# Patient Record
Sex: Male | Born: 1970 | Race: White | Hispanic: No | Marital: Married | Smoking: Former smoker
Health system: Southern US, Community
[De-identification: ages and names within clinical notes are randomized; demographics above are authoritative.]

## PROBLEM LIST (undated history)

## (undated) DIAGNOSIS — F419 Anxiety disorder, unspecified: Secondary | ICD-10-CM

## (undated) DIAGNOSIS — R110 Nausea: Secondary | ICD-10-CM

## (undated) DIAGNOSIS — K5792 Diverticulitis of intestine, part unspecified, without perforation or abscess without bleeding: Secondary | ICD-10-CM

## (undated) DIAGNOSIS — E785 Hyperlipidemia, unspecified: Secondary | ICD-10-CM

## (undated) DIAGNOSIS — N321 Vesicointestinal fistula: Secondary | ICD-10-CM

## (undated) DIAGNOSIS — I10 Essential (primary) hypertension: Secondary | ICD-10-CM

## (undated) DIAGNOSIS — K219 Gastro-esophageal reflux disease without esophagitis: Secondary | ICD-10-CM

## (undated) DIAGNOSIS — F32A Depression, unspecified: Secondary | ICD-10-CM

## (undated) DIAGNOSIS — F329 Major depressive disorder, single episode, unspecified: Secondary | ICD-10-CM

## (undated) DIAGNOSIS — R55 Syncope and collapse: Secondary | ICD-10-CM

## (undated) DIAGNOSIS — F319 Bipolar disorder, unspecified: Secondary | ICD-10-CM

## (undated) DIAGNOSIS — S46219A Strain of muscle, fascia and tendon of other parts of biceps, unspecified arm, initial encounter: Secondary | ICD-10-CM

## (undated) HISTORY — DX: Major depressive disorder, single episode, unspecified: F32.9

## (undated) HISTORY — DX: Strain of muscle, fascia and tendon of other parts of biceps, unspecified arm, initial encounter: S46.219A

## (undated) HISTORY — DX: Diverticulitis of intestine, part unspecified, without perforation or abscess without bleeding: K57.92

## (undated) HISTORY — DX: Depression, unspecified: F32.A

## (undated) HISTORY — DX: Vesicointestinal fistula: N32.1

## (undated) HISTORY — DX: Gastro-esophageal reflux disease without esophagitis: K21.9

## (undated) HISTORY — DX: Syncope and collapse: R55

## (undated) HISTORY — DX: Essential (primary) hypertension: I10

## (undated) HISTORY — DX: Bipolar disorder, unspecified: F31.9

## (undated) HISTORY — DX: Anxiety disorder, unspecified: F41.9

## (undated) HISTORY — DX: Hyperlipidemia, unspecified: E78.5

---

## 2008-03-23 HISTORY — PX: APPENDECTOMY: SHX54

## 2009-01-21 HISTORY — PX: COLECTOMY WITH COLOSTOMY CREATION/HARTMANN PROCEDURE: SHX6598

## 2009-02-02 ENCOUNTER — Inpatient Hospital Stay: Payer: Self-pay | Admitting: Surgery

## 2009-02-21 ENCOUNTER — Ambulatory Visit: Payer: Self-pay | Admitting: Surgery

## 2009-03-23 DIAGNOSIS — N321 Vesicointestinal fistula: Secondary | ICD-10-CM

## 2009-03-23 DIAGNOSIS — Z8719 Personal history of other diseases of the digestive system: Secondary | ICD-10-CM | POA: Insufficient documentation

## 2009-03-23 HISTORY — PX: ROTATOR CUFF REPAIR: SHX139

## 2009-03-23 HISTORY — DX: Vesicointestinal fistula: N32.1

## 2009-06-06 ENCOUNTER — Ambulatory Visit: Payer: Self-pay | Admitting: Surgery

## 2009-06-27 ENCOUNTER — Ambulatory Visit: Payer: Self-pay | Admitting: Gastroenterology

## 2009-07-19 ENCOUNTER — Ambulatory Visit: Payer: Self-pay | Admitting: Surgery

## 2009-07-21 HISTORY — PX: COLOSTOMY TAKEDOWN: SHX5258

## 2009-07-26 ENCOUNTER — Ambulatory Visit: Payer: Self-pay | Admitting: Orthopedic Surgery

## 2009-08-02 ENCOUNTER — Inpatient Hospital Stay: Payer: Self-pay | Admitting: Surgery

## 2009-08-13 ENCOUNTER — Ambulatory Visit: Payer: Self-pay | Admitting: Orthopedic Surgery

## 2009-09-16 ENCOUNTER — Emergency Department: Payer: Self-pay | Admitting: Unknown Physician Specialty

## 2009-12-22 ENCOUNTER — Emergency Department: Payer: Self-pay | Admitting: Emergency Medicine

## 2011-04-24 DIAGNOSIS — S46219A Strain of muscle, fascia and tendon of other parts of biceps, unspecified arm, initial encounter: Secondary | ICD-10-CM

## 2011-04-24 HISTORY — PX: BICEPS TENDON REPAIR: SHX566

## 2011-04-24 HISTORY — DX: Strain of muscle, fascia and tendon of other parts of biceps, unspecified arm, initial encounter: S46.219A

## 2011-05-06 ENCOUNTER — Emergency Department: Payer: Self-pay | Admitting: Emergency Medicine

## 2011-05-10 LAB — COMPREHENSIVE METABOLIC PANEL
Alkaline Phosphatase: 79 U/L (ref 50–136)
Anion Gap: 10 (ref 7–16)
BUN: 7 mg/dL (ref 7–18)
Bilirubin,Total: 0.3 mg/dL (ref 0.2–1.0)
Calcium, Total: 9 mg/dL (ref 8.5–10.1)
Chloride: 108 mmol/L — ABNORMAL HIGH (ref 98–107)
Co2: 23 mmol/L (ref 21–32)
Creatinine: 0.93 mg/dL (ref 0.60–1.30)
EGFR (Non-African Amer.): >60
Osmolality: 279 (ref 275–301)
Potassium: 3.6 mmol/L (ref 3.5–5.1)
Sodium: 141 mmol/L (ref 136–145)

## 2011-05-10 LAB — CBC
HCT: 46.2 % (ref 40.0–52.0)
MCV: 94 fL (ref 80–100)
Platelet: 258 10*3/uL (ref 150–440)
RBC: 4.94 10*6/uL (ref 4.40–5.90)
RDW: 13.6 % (ref 11.5–14.5)
WBC: 18.1 10*3/uL — ABNORMAL HIGH (ref 3.8–10.6)

## 2011-05-10 LAB — ACETAMINOPHEN LEVEL: Acetaminophen: 9 ug/mL — ABNORMAL LOW

## 2011-05-10 LAB — DRUG SCREEN, URINE
Barbiturates, Ur Screen: NEGATIVE (ref ?–200)
Cocaine Metabolite,Ur ~~LOC~~: NEGATIVE (ref ?–300)
MDMA (Ecstasy)Ur Screen: NEGATIVE (ref ?–500)
Opiate, Ur Screen: POSITIVE (ref ?–300)
Tricyclic, Ur Screen: NEGATIVE (ref ?–1000)

## 2011-05-10 LAB — ETHANOL
Ethanol %: <0.003 % (ref 0.000–0.080)
Ethanol: <3 mg/dL

## 2011-05-11 ENCOUNTER — Inpatient Hospital Stay: Payer: Self-pay | Admitting: Psychiatry

## 2011-05-17 ENCOUNTER — Emergency Department: Payer: Self-pay | Admitting: Internal Medicine

## 2011-05-17 LAB — COMPREHENSIVE METABOLIC PANEL
Anion Gap: 11 (ref 7–16)
BUN: 15 mg/dL (ref 7–18)
Bilirubin,Total: 0.3 mg/dL (ref 0.2–1.0)
Chloride: 100 mmol/L (ref 98–107)
Co2: 24 mmol/L (ref 21–32)
Creatinine: 0.96 mg/dL (ref 0.60–1.30)
EGFR (African American): >60
EGFR (Non-African Amer.): >60
Osmolality: 271 (ref 275–301)
Potassium: 3.3 mmol/L — ABNORMAL LOW (ref 3.5–5.1)
SGOT(AST): 18 U/L (ref 15–37)
Sodium: 135 mmol/L — ABNORMAL LOW (ref 136–145)
Total Protein: 7.8 g/dL (ref 6.4–8.2)

## 2011-05-17 LAB — ETHANOL
Ethanol %: <0.003 % (ref 0.000–0.080)
Ethanol: <3 mg/dL

## 2011-05-17 LAB — DRUG SCREEN, URINE
Amphetamines, Ur Screen: NEGATIVE (ref ?–1000)
Barbiturates, Ur Screen: NEGATIVE (ref ?–200)
Cocaine Metabolite,Ur ~~LOC~~: NEGATIVE (ref ?–300)
MDMA (Ecstasy)Ur Screen: NEGATIVE (ref ?–500)
Opiate, Ur Screen: POSITIVE (ref ?–300)
Phencyclidine (PCP) Ur S: NEGATIVE (ref ?–25)
Tricyclic, Ur Screen: NEGATIVE (ref ?–1000)

## 2011-05-17 LAB — CBC
HCT: 46.3 % (ref 40.0–52.0)
HGB: 15.9 g/dL (ref 13.0–18.0)
MCH: 32.1 pg (ref 26.0–34.0)
MCHC: 34.4 g/dL (ref 32.0–36.0)
MCV: 93 fL (ref 80–100)
RDW: 13 % (ref 11.5–14.5)
WBC: 13.8 10*3/uL — ABNORMAL HIGH (ref 3.8–10.6)

## 2011-05-17 LAB — CK TOTAL AND CKMB (NOT AT ARMC)
CK, Total: 196 U/L (ref 35–232)
CK-MB: 1.4 ng/mL (ref 0.5–3.6)

## 2011-05-17 LAB — TROPONIN I: Troponin-I: <0.02 ng/mL

## 2011-06-16 ENCOUNTER — Ambulatory Visit (INDEPENDENT_AMBULATORY_CARE_PROVIDER_SITE_OTHER): Payer: PRIVATE HEALTH INSURANCE | Admitting: Internal Medicine

## 2011-06-16 ENCOUNTER — Encounter: Payer: Self-pay | Admitting: Internal Medicine

## 2011-06-16 VITALS — BP 128/80 | HR 81 | Temp 98.0°F | Ht 64.0 in | Wt 193.0 lb

## 2011-06-16 DIAGNOSIS — I10 Essential (primary) hypertension: Secondary | ICD-10-CM

## 2011-06-16 DIAGNOSIS — Z0000 Encounter for general adult medical examination without abnormal findings: Secondary | ICD-10-CM

## 2011-06-16 DIAGNOSIS — Z8659 Personal history of other mental and behavioral disorders: Secondary | ICD-10-CM | POA: Insufficient documentation

## 2011-06-16 DIAGNOSIS — F319 Bipolar disorder, unspecified: Secondary | ICD-10-CM

## 2011-06-16 LAB — CBC WITH DIFFERENTIAL/PLATELET
Basophils Absolute: 0 10*3/uL (ref 0.0–0.1)
Eosinophils Relative: 4.5 % (ref 0.0–5.0)
HCT: 48.8 % (ref 39.0–52.0)
Hemoglobin: 16.5 g/dL (ref 13.0–17.0)
Lymphs Abs: 2.6 10*3/uL (ref 0.7–4.0)
Monocytes Relative: 10.5 % (ref 3.0–12.0)
Neutro Abs: 8.1 10*3/uL — ABNORMAL HIGH (ref 1.4–7.7)
RDW: 13.7 % (ref 11.5–14.6)

## 2011-06-16 LAB — COMPREHENSIVE METABOLIC PANEL
ALT: 22 U/L (ref 0–53)
AST: 21 U/L (ref 0–37)
BUN: 12 mg/dL (ref 6–23)
Calcium: 9.4 mg/dL (ref 8.4–10.5)
Creatinine, Ser: 0.8 mg/dL (ref 0.4–1.5)
GFR: 110.96 mL/min (ref >60.00)
Total Bilirubin: 0.3 mg/dL (ref 0.3–1.2)

## 2011-06-16 LAB — LIPID PANEL: Total CHOL/HDL Ratio: 6

## 2011-06-16 MED ORDER — AMLODIPINE BESYLATE 5 MG PO TABS: 5.0000 mg | ORAL_TABLET | Freq: Every day | ORAL | Status: DC

## 2011-06-16 NOTE — Assessment & Plan Note (Signed)
Pt reports he is doing well on Depakote. Will get records on previous evaluation and management. Follow up 1 month.

## 2011-06-16 NOTE — Assessment & Plan Note (Signed)
Will stop HCTZ and start Amlodipine. Pt will record BP and call if any <80/50 or >160/100 or of any symptomatic lightheadedness. Will check renal function today. Follow up 1 month.

## 2011-06-16 NOTE — Progress Notes (Signed)
Subjective:    Patient ID: Sean Herring, male    DOB: 10-22-1970, 41 y.o.   MRN: [id number]  HPI 41YO male with h/o bipolar disorder and HTN presents to establish care.  He reports that he recently suffered worsening depression and was hospitalized. He attributes depression to handling medical issues including diverticulitis and surgical removal of his sigmoid colon last year. He reports it was very difficult for him to handle having an ostomy, and he is much better now that this was taken down. He also notes significant improvement in his depression after his hospitalization and starting on Tegretol to help stabilize his moods.  He denies any side effects from that medication.    He is concerned today about recent labile BP. He has been taking HCTZ 25mg  daily and notes BP varies from 80/50s to 170/100s.  He has had 2 syncopal episodes when BP was 80/50s.  He denies any chest pain, palpitations, nausea, dyspnea or other symptoms with these episodes.  Outpatient Encounter Prescriptions as of 06/16/2011  Medication Sig Dispense Refill  . amLODipine (NORVASC) 5 MG tablet Take 1 tablet (5 mg total) by mouth daily.  30 tablet  11  . carbamazepine (TEGRETOL) 200 MG tablet Take 200 mg by mouth 2 (two) times daily.      . citalopram (CELEXA) 40 MG tablet Take 40 mg by mouth daily.      Marland Kitchen gabapentin (NEURONTIN) 300 MG capsule Take 300 mg by mouth 3 (three) times daily.      Marland Kitchen DISCONTD: hydrochlorothiazide (HYDRODIURIL) 25 MG tablet Take 12.5 mg by mouth 2 (two) times daily.        Review of Systems  Constitutional: Negative for fever, chills, activity change, appetite change, fatigue and unexpected weight change.  Eyes: Negative for visual disturbance.  Respiratory: Negative for cough and shortness of breath.   Cardiovascular: Negative for chest pain, palpitations and leg swelling.  Gastrointestinal: Negative for abdominal pain and abdominal distention.  Genitourinary: Negative for dysuria, urgency  and difficulty urinating.  Musculoskeletal: Negative for arthralgias and gait problem.  Skin: Negative for color change and rash.  Neurological: Positive for syncope and light-headedness.  Hematological: Negative for adenopathy.  Psychiatric/Behavioral: Positive for dysphoric mood. Negative for sleep disturbance. The patient is not nervous/anxious.    BP 128/80  Pulse 81  Temp(Src) 98 F (36.7 C) (Oral)  Ht 5\' 4"  (1.626 m)  Wt 193 lb (87.544 kg)  BMI 33.13 kg/m2  SpO2 97%     Objective:   Physical Exam  Constitutional: He is oriented to person, place, and time. He appears well-developed and well-nourished. No distress.  HENT:  Head: Normocephalic and atraumatic.  Right Ear: External ear normal.  Left Ear: External ear normal.  Nose: Nose normal.  Mouth/Throat: Oropharynx is clear and moist. No oropharyngeal exudate.  Eyes: Conjunctivae and EOM are normal. Pupils are equal, round, and reactive to light. Right eye exhibits no discharge. Left eye exhibits no discharge. No scleral icterus.  Neck: Normal range of motion. Neck supple. No tracheal deviation present. No thyromegaly present.  Cardiovascular: Normal rate, regular rhythm and normal heart sounds.  Exam reveals no gallop and no friction rub.   No murmur heard. Pulmonary/Chest: Effort normal and breath sounds normal. No respiratory distress. He has no wheezes. He has no rales. He exhibits no tenderness.  Abdominal: Soft. Bowel sounds are normal. He exhibits no distension and no mass. There is no tenderness. There is no rebound and no guarding.    Musculoskeletal:  He exhibits no edema.       Right elbow: He exhibits decreased range of motion.       Arms: Lymphadenopathy:    He has no cervical adenopathy.  Neurological: He is alert and oriented to person, place, and time. No cranial nerve deficit. Coordination normal.  Skin: Skin is warm and dry. No rash noted. He is not diaphoretic. No erythema. No pallor.  Psychiatric: He  has a normal mood and affect. His behavior is normal. Judgment and thought content normal.          Assessment & Plan:

## 2011-06-16 NOTE — Patient Instructions (Signed)
Stop HCTZ. Start Amlodipine 5mg  daily.   Record blood pressure every 1-2 days. Follow up 1 month.

## 2011-06-18 ENCOUNTER — Other Ambulatory Visit: Payer: Self-pay | Admitting: *Deleted

## 2011-06-18 MED ORDER — ATORVASTATIN CALCIUM 20 MG PO TABS: 20.0000 mg | ORAL_TABLET | Freq: Every day | ORAL | Status: DC

## 2011-06-26 ENCOUNTER — Ambulatory Visit: Payer: Self-pay | Admitting: Internal Medicine

## 2011-07-17 ENCOUNTER — Encounter: Payer: Self-pay | Admitting: Internal Medicine

## 2011-07-17 ENCOUNTER — Ambulatory Visit (INDEPENDENT_AMBULATORY_CARE_PROVIDER_SITE_OTHER): Payer: PRIVATE HEALTH INSURANCE | Admitting: Internal Medicine

## 2011-07-17 VITALS — BP 125/85 | HR 69 | Ht 64.0 in | Wt 198.0 lb

## 2011-07-17 DIAGNOSIS — I10 Essential (primary) hypertension: Secondary | ICD-10-CM

## 2011-07-17 DIAGNOSIS — F319 Bipolar disorder, unspecified: Secondary | ICD-10-CM

## 2011-07-17 DIAGNOSIS — G8929 Other chronic pain: Secondary | ICD-10-CM

## 2011-07-17 DIAGNOSIS — E785 Hyperlipidemia, unspecified: Secondary | ICD-10-CM

## 2011-07-17 LAB — COMPREHENSIVE METABOLIC PANEL
Albumin: 4 g/dL (ref 3.5–5.2)
BUN: 11 mg/dL (ref 6–23)
CO2: 24 mEq/L (ref 19–32)
Calcium: 9 mg/dL (ref 8.4–10.5)
Chloride: 107 mEq/L (ref 96–112)
Creatinine, Ser: 0.8 mg/dL (ref 0.4–1.5)
GFR: 117.5 mL/min (ref >60.00)
Glucose, Bld: 98 mg/dL (ref 70–99)

## 2011-07-17 LAB — LIPID PANEL
Cholesterol: 163 mg/dL (ref 0–200)
HDL: 42 mg/dL (ref >39.00)
Triglycerides: 62 mg/dL (ref 0.0–149.0)

## 2011-07-17 MED ORDER — CARBAMAZEPINE 200 MG PO TABS: 200.0000 mg | ORAL_TABLET | Freq: Two times a day (BID) | ORAL | Status: DC

## 2011-07-17 MED ORDER — CITALOPRAM HYDROBROMIDE 40 MG PO TABS: 40.0000 mg | ORAL_TABLET | Freq: Every day | ORAL | Status: DC

## 2011-07-17 MED ORDER — GABAPENTIN 300 MG PO CAPS: 300.0000 mg | ORAL_CAPSULE | Freq: Three times a day (TID) | ORAL | Status: DC

## 2011-07-17 NOTE — Progress Notes (Signed)
Subjective:    Patient ID: Sean Herring, male    DOB: [birth year], 41 y.o.   MRN: [id number]  HPI 41 year old male with history of hypertension,hyperlipidemia, bipolar disorder, and chronic pain in his right shoulder presents for followup. In regards to his hypertension, he reports full compliance with his medication. He denies any noted side effects. He denies any headache, palpitations, shortness of breath.  In regards to his bipolar disorder, he reports he has had difficulty getting in touch with his psychiatrist for refills on his medications. He is now completely out of medicine. He reports some increase in anxiety and depression.  In regards to chronic pain in his right shoulder he reports some persistent issues. He notes improvement with Neurontin he is planning to set up followup with his orthopedic surgeon.  In regards to hyperlipidemia, he started Lipitor and reports no side effects from this medication.   Outpatient Encounter Prescriptions as of 07/17/2011  Medication Sig Dispense Refill  . amLODipine (NORVASC) 5 MG tablet Take 1 tablet (5 mg total) by mouth daily.  30 tablet  11  . aspirin EC 81 MG tablet Take 81 mg by mouth daily.      Marland Kitchen atorvastatin (LIPITOR) 20 MG tablet Take 1 tablet (20 mg total) by mouth daily.  30 tablet  3  . carbamazepine (TEGRETOL) 200 MG tablet Take 1 tablet (200 mg total) by mouth 2 (two) times daily.  60 tablet  6  . citalopram (CELEXA) 40 MG tablet Take 1 tablet (40 mg total) by mouth daily.  30 tablet  6  . gabapentin (NEURONTIN) 300 MG capsule Take 1 capsule (300 mg total) by mouth 3 (three) times daily.  90 capsule  6    Review of Systems  Constitutional: Negative for fever, chills, activity change, appetite change, fatigue and unexpected weight change.  Eyes: Negative for visual disturbance.  Respiratory: Negative for cough and shortness of breath.   Cardiovascular: Negative for chest pain, palpitations and leg swelling.  Gastrointestinal:  Negative for abdominal pain and abdominal distention.  Genitourinary: Negative for dysuria, urgency and difficulty urinating.  Musculoskeletal: Negative for arthralgias and gait problem.  Skin: Negative for color change and rash.  Hematological: Negative for adenopathy.  Psychiatric/Behavioral: Positive for dysphoric mood. Negative for sleep disturbance. The patient is nervous/anxious.    BP 125/85  Pulse 69  Ht 5\' 4"  (1.626 m)  Wt 198 lb (89.812 kg)  BMI 33.99 kg/m2     Objective:   Physical Exam  Constitutional: He is oriented to person, place, and time. He appears well-developed and well-nourished. No distress.  HENT:  Head: Normocephalic and atraumatic.  Right Ear: External ear normal.  Left Ear: External ear normal.  Nose: Nose normal.  Mouth/Throat: Oropharynx is clear and moist. No oropharyngeal exudate.  Eyes: Conjunctivae and EOM are normal. Pupils are equal, round, and reactive to light. Right eye exhibits no discharge. Left eye exhibits no discharge. No scleral icterus.  Neck: Normal range of motion. Neck supple. No tracheal deviation present. No thyromegaly present.  Cardiovascular: Normal rate, regular rhythm and normal heart sounds.  Exam reveals no gallop and no friction rub.   No murmur heard. Pulmonary/Chest: Effort normal and breath sounds normal. No respiratory distress. He has no wheezes. He has no rales. He exhibits no tenderness.  Musculoskeletal: Normal range of motion. He exhibits no edema.  Lymphadenopathy:    He has no cervical adenopathy.  Neurological: He is alert and oriented to person, place, and time. No cranial  nerve deficit. Coordination normal.  Skin: Skin is warm and dry. No rash noted. He is not diaphoretic. No erythema. No pallor.  Psychiatric: He has a normal mood and affect. His behavior is normal. Judgment and thought content normal.          Assessment & Plan:

## 2011-07-17 NOTE — Assessment & Plan Note (Signed)
Will repeat lipids and LFTs today. Continue lipitor. Goal LDL<100.

## 2011-07-17 NOTE — Assessment & Plan Note (Signed)
BP well controlled. Will continue amlodipine. Follow up 6 months and prn.

## 2011-07-17 NOTE — Assessment & Plan Note (Signed)
Symptoms controlled when on medication. Will refill today. Follow up 6 months and prn.

## 2011-07-20 ENCOUNTER — Telehealth: Payer: Self-pay | Admitting: *Deleted

## 2011-07-20 ENCOUNTER — Other Ambulatory Visit: Payer: PRIVATE HEALTH INSURANCE

## 2011-07-20 NOTE — Telephone Encounter (Signed)
Message copied by Regis Bill on Mon Jul 20, 2011  8:28 AM ------      Message from: Ronna Polio A      Created: Fri Jul 17, 2011  5:11 PM       Labs look EXCELLENT. LDL cholesterol reduced by 100 points.

## 2011-07-20 NOTE — Telephone Encounter (Signed)
LMOM to inform patient with call back name & number/SLS

## 2011-07-29 ENCOUNTER — Telehealth: Payer: Self-pay | Admitting: Internal Medicine

## 2011-07-29 NOTE — Telephone Encounter (Signed)
Patient notified. Appt scheduled.

## 2011-07-29 NOTE — Telephone Encounter (Signed)
Caller: Marsha/Spouse; Phone Number: [phone]; Message from caller: Concerns for sleep apnea.  Wanting to know if he needs to be seen for sleepy study or if it could be his medicaitons.

## 2011-07-29 NOTE — Telephone Encounter (Signed)
Needs to be seen

## 2011-08-07 ENCOUNTER — Encounter: Payer: Self-pay | Admitting: Internal Medicine

## 2011-08-07 ENCOUNTER — Ambulatory Visit (INDEPENDENT_AMBULATORY_CARE_PROVIDER_SITE_OTHER): Payer: PRIVATE HEALTH INSURANCE | Admitting: Internal Medicine

## 2011-08-07 VITALS — BP 120/79 | HR 70 | Temp 98.3°F | Resp 16 | Wt 203.5 lb

## 2011-08-07 DIAGNOSIS — R0681 Apnea, not elsewhere classified: Secondary | ICD-10-CM | POA: Insufficient documentation

## 2011-08-07 DIAGNOSIS — R0989 Other specified symptoms and signs involving the circulatory and respiratory systems: Secondary | ICD-10-CM

## 2011-08-07 DIAGNOSIS — R400 Somnolence: Secondary | ICD-10-CM | POA: Insufficient documentation

## 2011-08-07 DIAGNOSIS — G4710 Hypersomnia, unspecified: Secondary | ICD-10-CM

## 2011-08-07 DIAGNOSIS — I10 Essential (primary) hypertension: Secondary | ICD-10-CM

## 2011-08-07 DIAGNOSIS — R0609 Other forms of dyspnea: Secondary | ICD-10-CM

## 2011-08-07 DIAGNOSIS — R0683 Snoring: Secondary | ICD-10-CM | POA: Insufficient documentation

## 2011-08-07 NOTE — Assessment & Plan Note (Signed)
Symptoms are concerning for sleep apnea. Will set up home sleep study as soon as possible.

## 2011-08-07 NOTE — Progress Notes (Signed)
Subjective:    Patient ID: Sean Herring, male    DOB: 1970-04-10, 41 y.o.   MRN: [id number]  HPI 41 year old male with history of hypertension, bipolar disorder presents for acute visit complaining of daytime somnolence and witnessed apnea at night. He reports that his wife was concerned because she noted he was snoring and had frequent episodes of pauses in his breathing. She also reported some possible cyanosis. She noted that he was gasping for breath intermittently. He has never been diagnosed with sleep apnea. He does have daytime somnolence. He would like to pursue additional evaluation.  In regards to his hypertension, he reports full compliance with his amlodipine. He denies any chest pain, palpitations, shortness of breath. He reports that his blood pressure has been well controlled when he has been checking it at home.  Outpatient Encounter Prescriptions as of 08/07/2011  Medication Sig Dispense Refill  . amLODipine (NORVASC) 5 MG tablet Take 1 tablet (5 mg total) by mouth daily.  30 tablet  11  . aspirin EC 81 MG tablet Take 81 mg by mouth daily.      Marland Kitchen atorvastatin (LIPITOR) 20 MG tablet Take 1 tablet (20 mg total) by mouth daily.  30 tablet  3  . carbamazepine (TEGRETOL) 200 MG tablet Take 1 tablet (200 mg total) by mouth 2 (two) times daily.  60 tablet  6  . citalopram (CELEXA) 40 MG tablet Take 1 tablet (40 mg total) by mouth daily.  30 tablet  6  . gabapentin (NEURONTIN) 300 MG capsule Take 1 capsule (300 mg total) by mouth 3 (three) times daily.  90 capsule  6    Review of Systems  Constitutional: Positive for fatigue. Negative for fever, chills, activity change, appetite change and unexpected weight change.  Eyes: Negative for visual disturbance.  Respiratory: Positive for apnea. Negative for cough and shortness of breath.   Cardiovascular: Negative for chest pain, palpitations and leg swelling.  Gastrointestinal: Negative for abdominal pain and abdominal distention.    Genitourinary: Negative for dysuria, urgency and difficulty urinating.  Musculoskeletal: Negative for arthralgias and gait problem.  Skin: Negative for color change and rash.  Hematological: Negative for adenopathy.  Psychiatric/Behavioral: Negative for sleep disturbance and dysphoric mood. The patient is not nervous/anxious.    BP 120/79  Pulse 70  Temp(Src) 98.3 F (36.8 C) (Oral)  Resp 16  Wt 203 lb 8 oz (92.307 kg)  SpO2 98%     Objective:   Physical Exam  Constitutional: He is oriented to person, place, and time. He appears well-developed and well-nourished. No distress.  HENT:  Head: Normocephalic and atraumatic.  Right Ear: External ear normal.  Left Ear: External ear normal.  Nose: Nose normal.  Mouth/Throat: Oropharynx is clear and moist. No oropharyngeal exudate.  Eyes: Conjunctivae and EOM are normal. Pupils are equal, round, and reactive to light. Right eye exhibits no discharge. Left eye exhibits no discharge. No scleral icterus.  Neck: Normal range of motion. Neck supple. No tracheal deviation present. No thyromegaly present.  Cardiovascular: Normal rate, regular rhythm and normal heart sounds.  Exam reveals no gallop and no friction rub.   No murmur heard. Pulmonary/Chest: Effort normal and breath sounds normal. No respiratory distress. He has no wheezes. He has no rales. He exhibits no tenderness.  Musculoskeletal: Normal range of motion. He exhibits no edema.  Lymphadenopathy:    He has no cervical adenopathy.  Neurological: He is alert and oriented to person, place, and time. No cranial nerve deficit.  Coordination normal.  Skin: Skin is warm and dry. No rash noted. He is not diaphoretic. No erythema. No pallor.  Psychiatric: He has a normal mood and affect. His behavior is normal. Judgment and thought content normal.          Assessment & Plan:

## 2011-08-07 NOTE — Assessment & Plan Note (Signed)
Patient with witnessed apnea while sleeping. Will set up a home sleep study for further evaluation.

## 2011-08-07 NOTE — Assessment & Plan Note (Signed)
Blood pressure well-controlled. Will continue amlodipine. Followup in one month.

## 2011-08-27 ENCOUNTER — Telehealth: Payer: Self-pay | Admitting: Internal Medicine

## 2011-08-27 NOTE — Telephone Encounter (Signed)
Mindi Junker called stated that mr [employer] is out of network for [employer]  Please advise where else he could go for this sleep study Insurance does not cover in home sleep studies

## 2011-08-28 NOTE — Telephone Encounter (Signed)
Wanting done before July 1,2013. I spoke with the patient's wife and informed her that I will send a referral to [facility].

## 2011-09-02 ENCOUNTER — Telehealth: Payer: Self-pay | Admitting: Internal Medicine

## 2011-09-02 NOTE — Telephone Encounter (Signed)
Sleep study test was normal.

## 2011-09-02 NOTE — Telephone Encounter (Signed)
Patients spouse advised as instructed via telephone. 

## 2011-09-11 ENCOUNTER — Encounter: Payer: Self-pay | Admitting: Internal Medicine

## 2011-09-11 ENCOUNTER — Ambulatory Visit (INDEPENDENT_AMBULATORY_CARE_PROVIDER_SITE_OTHER): Payer: PRIVATE HEALTH INSURANCE | Admitting: Internal Medicine

## 2011-09-11 VITALS — BP 130/82 | HR 82 | Temp 98.5°F | Ht 64.0 in | Wt 204.5 lb

## 2011-09-11 DIAGNOSIS — J020 Streptococcal pharyngitis: Secondary | ICD-10-CM | POA: Insufficient documentation

## 2011-09-11 DIAGNOSIS — I10 Essential (primary) hypertension: Secondary | ICD-10-CM

## 2011-09-11 DIAGNOSIS — J029 Acute pharyngitis, unspecified: Secondary | ICD-10-CM

## 2011-09-11 DIAGNOSIS — E785 Hyperlipidemia, unspecified: Secondary | ICD-10-CM

## 2011-09-11 MED ORDER — AMOXICILLIN-POT CLAVULANATE 875-125 MG PO TABS: 1.0000 | ORAL_TABLET | Freq: Two times a day (BID) | ORAL | Status: AC

## 2011-09-11 NOTE — Assessment & Plan Note (Signed)
Blood pressure well-controlled today. We'll continue current medications. Followup in 6 months.

## 2011-09-11 NOTE — Assessment & Plan Note (Signed)
Congratulated patient on marked improvement in cholesterol. LDL decreased by nearly 100 points, at 109. We'll continue atorvastatin. Repeat LFTs and lipids in 6 months.

## 2011-09-11 NOTE — Assessment & Plan Note (Signed)
Symptoms of sore throat with tender cervical adenopathy are most consistent with strep pharyngitis. Will treat empirically with Augmentin. Patient will continue ibuprofen as needed for pain. Patient will call if symptoms are not improving over the next 48 hours.

## 2011-09-11 NOTE — Progress Notes (Signed)
Subjective:    Patient ID: Sean Herring, male    DOB: [birth year], 41 y.o.   MRN: [id number]  HPI 41 year old male with history of hypertension and fatigue presents for followup. In regards to his hypertension, he reports full compliance with his medications. He denies any headache, chest pain, palpitations.  In regards to fatigue, he reports that symptoms are relatively unchanged. He recently underwent sleep study testing for evaluation of sleep apnea. Testing was negative. Recommendation was made that he lose weight. He has not yet started on weight loss program.  He is concerned today about a 2 day history of severe sore throat. He reports some difficulty swallowing food last night because of severe pain. He denies any fever, chills, nasal congestion, headache. He denies any known sick contacts. He took Advil last night with some improvement in throat pain.  Outpatient Encounter Prescriptions as of 09/11/2011  Medication Sig Dispense Refill  . amLODipine (NORVASC) 5 MG tablet Take 1 tablet (5 mg total) by mouth daily.  30 tablet  11  . aspirin EC 81 MG tablet Take 81 mg by mouth daily.      Marland Kitchen atorvastatin (LIPITOR) 20 MG tablet Take 1 tablet (20 mg total) by mouth daily.  30 tablet  3  . carbamazepine (TEGRETOL) 200 MG tablet Take 1 tablet (200 mg total) by mouth 2 (two) times daily.  60 tablet  6  . citalopram (CELEXA) 40 MG tablet Take 1 tablet (40 mg total) by mouth daily.  30 tablet  6  . gabapentin (NEURONTIN) 300 MG capsule Take 1 capsule (300 mg total) by mouth 3 (three) times daily.  90 capsule  6  . amoxicillin-clavulanate (AUGMENTIN) 875-125 MG per tablet Take 1 tablet by mouth 2 (two) times daily.  20 tablet  0    Review of Systems  Constitutional: Positive for fatigue. Negative for fever, chills, activity change, appetite change and unexpected weight change.  HENT: Positive for sore throat. Negative for hearing loss, ear pain, congestion, rhinorrhea, neck pain, sinus pressure  and ear discharge.   Eyes: Negative for visual disturbance.  Respiratory: Negative for cough, shortness of breath and wheezing.   Cardiovascular: Negative for chest pain, palpitations and leg swelling.  Gastrointestinal: Negative for abdominal pain and abdominal distention.  Genitourinary: Negative for dysuria, urgency and difficulty urinating.  Musculoskeletal: Negative for arthralgias and gait problem.  Skin: Negative for color change and rash.  Hematological: Positive for adenopathy.  Psychiatric/Behavioral: Negative for disturbed wake/sleep cycle and dysphoric mood. The patient is not nervous/anxious.    BP 130/82  Pulse 82  Temp 98.5 F (36.9 C) (Oral)  Ht 5\' 4"  (1.626 m)  Wt 204 lb 8 oz (92.761 kg)  BMI 35.10 kg/m2  SpO2 97%     Objective:   Physical Exam  Constitutional: He is oriented to person, place, and time. He appears well-developed and well-nourished. No distress.  HENT:  Head: Normocephalic and atraumatic.  Right Ear: Tympanic membrane, external ear and ear canal normal.  Left Ear: Tympanic membrane, external ear and ear canal normal.  Nose: Nose normal.  Mouth/Throat: Oropharyngeal exudate and posterior oropharyngeal erythema present.  Eyes: Conjunctivae and EOM are normal. Pupils are equal, round, and reactive to light. Right eye exhibits no discharge. Left eye exhibits no discharge. No scleral icterus.  Neck: Normal range of motion. Neck supple. No tracheal deviation present. No thyromegaly present.  Cardiovascular: Normal rate, regular rhythm and normal heart sounds.  Exam reveals no gallop and no friction rub.  No murmur heard. Pulmonary/Chest: Effort normal and breath sounds normal. No respiratory distress. He has no wheezes. He has no rales. He exhibits no tenderness.  Musculoskeletal: Normal range of motion. He exhibits no edema.  Lymphadenopathy:    He has cervical adenopathy.       Left cervical: Superficial cervical adenopathy present.  Neurological:  He is alert and oriented to person, place, and time. No cranial nerve deficit. Coordination normal.  Skin: Skin is warm and dry. No rash noted. He is not diaphoretic. No erythema. No pallor.  Psychiatric: He has a normal mood and affect. His behavior is normal. Judgment and thought content normal.          Assessment & Plan:

## 2011-09-14 ENCOUNTER — Encounter: Payer: Self-pay | Admitting: Internal Medicine

## 2011-10-10 ENCOUNTER — Other Ambulatory Visit: Payer: Self-pay | Admitting: Internal Medicine

## 2011-11-02 ENCOUNTER — Telehealth: Payer: Self-pay | Admitting: Internal Medicine

## 2011-11-02 NOTE — Telephone Encounter (Signed)
Caller: Marsha/Spouse; Patient Name: Sean Herring; PCP: Ronna Polio; Best Callback Phone Number: [phone].  Caller relates he had BP med change recently and was to monitor BP.  Nausea/ indigestion for approx 7 days, worse since 10/31/11.  BP running 160/100 on 10/30/11 am 153/97, in aftenoon and 116/64 in evening.  She relates he had episode on 8/4 where he had to lie down for 30 min with feet propped up  due to feeling weak.  Caller states patient went to work and no current BP reading available. Emergent sx ruled out.  Home care for the interim and parameters for callback given.  Call provider in 8 hours per HTN, Dx or Suspected protocol.  Caller requested appointment for 11/03/11 and that was scheduled with Dr. Dan Humphreys for 11/05/11.  Information to office for review and follow up.

## 2011-11-03 ENCOUNTER — Encounter: Payer: Self-pay | Admitting: Internal Medicine

## 2011-11-03 ENCOUNTER — Ambulatory Visit (INDEPENDENT_AMBULATORY_CARE_PROVIDER_SITE_OTHER): Payer: PRIVATE HEALTH INSURANCE | Admitting: Internal Medicine

## 2011-11-03 VITALS — BP 140/90 | HR 72 | Temp 98.2°F | Ht 64.0 in | Wt 210.5 lb

## 2011-11-03 DIAGNOSIS — Z8249 Family history of ischemic heart disease and other diseases of the circulatory system: Secondary | ICD-10-CM | POA: Insufficient documentation

## 2011-11-03 DIAGNOSIS — I10 Essential (primary) hypertension: Secondary | ICD-10-CM

## 2011-11-03 DIAGNOSIS — R61 Generalized hyperhidrosis: Secondary | ICD-10-CM | POA: Insufficient documentation

## 2011-11-03 DIAGNOSIS — R110 Nausea: Secondary | ICD-10-CM

## 2011-11-03 LAB — CBC WITH DIFFERENTIAL/PLATELET
Basophils Absolute: 0 10*3/uL (ref 0.0–0.1)
HCT: 44.8 % (ref 39.0–52.0)
Hemoglobin: 14.9 g/dL (ref 13.0–17.0)
Lymphs Abs: 3.3 10*3/uL (ref 0.7–4.0)
MCV: 94.8 fl (ref 78.0–100.0)
Monocytes Absolute: 1.4 10*3/uL — ABNORMAL HIGH (ref 0.1–1.0)
Neutro Abs: 9.8 10*3/uL — ABNORMAL HIGH (ref 1.4–7.7)
Platelets: 247 10*3/uL (ref 150.0–400.0)
RDW: 13.5 % (ref 11.5–14.6)

## 2011-11-03 LAB — TROPONIN I: Troponin I: <0.01 ng/mL (ref <0.06)

## 2011-11-03 LAB — COMPREHENSIVE METABOLIC PANEL
Alkaline Phosphatase: 88 U/L (ref 39–117)
Creatinine, Ser: 0.7 mg/dL (ref 0.4–1.5)
GFR: 126.65 mL/min (ref >60.00)
Glucose, Bld: 101 mg/dL — ABNORMAL HIGH (ref 70–99)
Sodium: 135 mEq/L (ref 135–145)
Total Bilirubin: 0.2 mg/dL — ABNORMAL LOW (ref 0.3–1.2)
Total Protein: 7.6 g/dL (ref 6.0–8.3)

## 2011-11-03 LAB — TSH: TSH: 0.69 u[IU]/mL (ref 0.35–5.50)

## 2011-11-03 NOTE — Progress Notes (Signed)
Subjective:    Patient ID: Sean Herring, male    DOB: 07/18/1970, 41 y.o.   MRN: [id number]  HPI 41 year old male with history of hypertension and bipolar disorder presents for acute visit complaining of lightheadedness, nausea, and diaphoresis. He reports that symptoms first began about 2 or 3 weeks ago. He has been having episodes of couple of times per week in which he becomes diaphoretic and lightheaded. He has to lay down in order to avoid passing out. His wife reports that he appears to be gray during these episodes. He also reports nausea but no vomiting during episodes. He denies any chest pain, arm pain, jaw pain, palpitations. During episodes, his blood pressure is typically low, near 110/50. However, in between episodes his blood pressure has been more elevated, as high as 160s over 100. He denies any other symptoms such as fever, chills, abdominal pain, change in appetite, anxiety, dyspnea. He reports full compliance with his medications. He notes a strong family history of heart disease including setting cardiac death. He has been worried that his symptoms might reflect heart disease.  Outpatient Encounter Prescriptions as of 11/03/2011  Medication Sig Dispense Refill  . amLODipine (NORVASC) 5 MG tablet Take 1 tablet (5 mg total) by mouth daily.  30 tablet  11  . aspirin EC 81 MG tablet Take 81 mg by mouth daily.      . carbamazepine (TEGRETOL) 200 MG tablet Take 1 tablet (200 mg total) by mouth 2 (two) times daily.  60 tablet  6  . citalopram (CELEXA) 40 MG tablet Take 1 tablet (40 mg total) by mouth daily.  30 tablet  6  . gabapentin (NEURONTIN) 300 MG capsule Take 1 capsule (300 mg total) by mouth 3 (three) times daily.  90 capsule  6  . DISCONTD: atorvastatin (LIPITOR) 20 MG tablet TAKE 1 TABLET BY MOUTH ONCE A DAY  30 tablet  3   BP 140/90  Pulse 72  Temp 98.2 F (36.8 C) (Oral)  Ht 5\' 4"  (1.626 m)  Wt 210 lb 8 oz (95.482 kg)  BMI 36.13 kg/m2  SpO2 93%  Review of Systems    Constitutional: Positive for diaphoresis and fatigue. Negative for fever, chills, activity change, appetite change and unexpected weight change.  Eyes: Negative for visual disturbance.  Respiratory: Negative for cough and shortness of breath.   Cardiovascular: Negative for chest pain, palpitations and leg swelling.  Gastrointestinal: Negative for abdominal pain and abdominal distention.  Genitourinary: Negative for dysuria, urgency and difficulty urinating.  Musculoskeletal: Negative for myalgias, arthralgias and gait problem.  Skin: Negative for color change and rash.  Neurological: Positive for light-headedness.  Hematological: Negative for adenopathy.  Psychiatric/Behavioral: Negative for disturbed wake/sleep cycle and dysphoric mood. The patient is not nervous/anxious.        Objective:   Physical Exam  Constitutional: He is oriented to person, place, and time. He appears well-developed and well-nourished. No distress.  HENT:  Head: Normocephalic and atraumatic.  Right Ear: External ear normal.  Left Ear: External ear normal.  Nose: Nose normal.  Mouth/Throat: Oropharynx is clear and moist. No oropharyngeal exudate.  Eyes: Conjunctivae and EOM are normal. Pupils are equal, round, and reactive to light. Right eye exhibits no discharge. Left eye exhibits no discharge. No scleral icterus.  Neck: Normal range of motion. Neck supple. No tracheal deviation present. No thyromegaly present.  Cardiovascular: Normal rate, regular rhythm and normal heart sounds.  Exam reveals no gallop and no friction rub.   No  murmur heard. Pulmonary/Chest: Effort normal and breath sounds normal. No respiratory distress. He has no wheezes. He has no rales. He exhibits no tenderness.  Abdominal: Soft. Bowel sounds are normal. He exhibits no distension and no mass. There is no tenderness. There is no rebound and no guarding.  Musculoskeletal: Normal range of motion. He exhibits no edema.  Lymphadenopathy:     He has no cervical adenopathy.  Neurological: He is alert and oriented to person, place, and time. No cranial nerve deficit. Coordination normal.  Skin: Skin is warm and dry. No rash noted. He is not diaphoretic. No erythema. No pallor.  Psychiatric: He has a normal mood and affect. His behavior is normal. Judgment and thought content normal.          Assessment & Plan:

## 2011-11-03 NOTE — Assessment & Plan Note (Signed)
As above, question if this may be anginal equivalent. EKG normal today. Will set up cardiology referral for possible stress test. Also checking CBC, CMP, TSH with labs today.

## 2011-11-03 NOTE — Assessment & Plan Note (Signed)
BP quite variable over last 2 weeks. Slightly elevated today. Discussed potentially adding a low dose betablocker, but will hold off for now and monitor. Pt wife will email with 1-2 BP readings per day. Also encouraged pt to increased fluid intake (preferably something with electrolytes). Follow up 2 weeks.

## 2011-11-03 NOTE — Assessment & Plan Note (Signed)
Recent episodes of nausea, diaphoresis, hypotension, fatigue concerning for anginal equivalent. Pt has risk for CAD including HTN and HL with strong family history. EKG today normal. Will set up cardiology evaluation. Question if pt might benefit from stress test for further assessment. Will send CBC, CMP, TSH, with labs today. Follow up 2 weeks.

## 2011-11-04 ENCOUNTER — Ambulatory Visit (INDEPENDENT_AMBULATORY_CARE_PROVIDER_SITE_OTHER)
Admission: RE | Admit: 2011-11-04 | Discharge: 2011-11-04 | Disposition: A | Discharge status: Home / Self Care | Payer: PRIVATE HEALTH INSURANCE | Source: Ambulatory Visit | Attending: Internal Medicine | Admitting: Internal Medicine

## 2011-11-04 ENCOUNTER — Telehealth: Payer: Self-pay | Admitting: Internal Medicine

## 2011-11-04 ENCOUNTER — Other Ambulatory Visit: Payer: Self-pay | Admitting: Internal Medicine

## 2011-11-04 DIAGNOSIS — R079 Chest pain, unspecified: Secondary | ICD-10-CM

## 2011-11-04 LAB — HELICOBACTER PYLORI  ANTIBODY, IGM: Helicobacter pylori, IgM: 3.2 U/mL (ref <9.0)

## 2011-11-04 MED ORDER — CARVEDILOL 6.25 MG PO TABS: 6.2500 mg | ORAL_TABLET | Freq: Two times a day (BID) | ORAL | Status: DC

## 2011-11-04 NOTE — Telephone Encounter (Signed)
Patients spouse advised as instructed via telephone, Rx sent to [facility] [place].

## 2011-11-04 NOTE — Telephone Encounter (Signed)
We should go ahead and start Carvedilol 6.25mg  po bid. Please call in 1 month supply with 3 refills.

## 2011-11-04 NOTE — Telephone Encounter (Signed)
Patient stopped by to tell us what his blood pressure was this morning at 4:45 158/107 and 93 pulse.  At 5:30 160/97 and pulse rate was 74. At 6:30 140/90 pulse 70, he took medication at 6:00.

## 2011-11-05 ENCOUNTER — Ambulatory Visit (INDEPENDENT_AMBULATORY_CARE_PROVIDER_SITE_OTHER): Payer: PRIVATE HEALTH INSURANCE | Admitting: Cardiovascular Disease

## 2011-11-05 ENCOUNTER — Encounter: Payer: Self-pay | Admitting: Cardiovascular Disease

## 2011-11-05 VITALS — BP 110/80 | HR 64 | Ht 64.0 in | Wt 210.2 lb

## 2011-11-05 VITALS — BP 129/86 | HR 73 | Ht 64.0 in | Wt 210.2 lb

## 2011-11-05 DIAGNOSIS — R0609 Other forms of dyspnea: Secondary | ICD-10-CM

## 2011-11-05 DIAGNOSIS — R0600 Dyspnea, unspecified: Secondary | ICD-10-CM

## 2011-11-05 DIAGNOSIS — R0989 Other specified symptoms and signs involving the circulatory and respiratory systems: Secondary | ICD-10-CM

## 2011-11-05 DIAGNOSIS — I10 Essential (primary) hypertension: Secondary | ICD-10-CM

## 2011-11-05 DIAGNOSIS — R61 Generalized hyperhidrosis: Secondary | ICD-10-CM

## 2011-11-05 NOTE — Progress Notes (Signed)
HPI  This is 41 year old who was referred by Dr. Dan Humphreys for evaluation of possible angina. The patient has a history of hypertension and bipolar disorder. He was diagnosed with hypertension early this year but has been noted to have very labile blood pressure. His blood pressure can go as high as 170/110 and with drop in the same day to 110/60. Recently he started having episodes of lightheadedness, nausea, and diaphoresis. He reports that symptoms first began about 2 or 3 weeks ago. He has been having episodes of couple of times per week in which he becomes diaphoretic and lightheaded. He has to lay down in order to avoid passing out. His wife reports that he appears to be gray during these episodes. He also reports nausea but no vomiting during episodes. He denies any chest pain, arm pain, jaw pain, palpitations.   No Known Allergies   Current Outpatient Prescriptions on File Prior to Visit  Medication Sig Dispense Refill  . amLODipine (NORVASC) 5 MG tablet Take 1 tablet (5 mg total) by mouth daily.  30 tablet  11  . aspirin EC 81 MG tablet Take 81 mg by mouth daily.      . carbamazepine (TEGRETOL) 200 MG tablet Take 1 tablet (200 mg total) by mouth 2 (two) times daily.  60 tablet  6  . carvedilol (COREG) 6.25 MG tablet Take 1 tablet (6.25 mg total) by mouth 2 (two) times daily with a meal.  60 tablet  3  . citalopram (CELEXA) 40 MG tablet Take 1 tablet (40 mg total) by mouth daily.  30 tablet  6  . gabapentin (NEURONTIN) 300 MG capsule Take 1 capsule (300 mg total) by mouth 3 (three) times daily.  90 capsule  6     Past Medical History  Diagnosis Date  . Biceps tendon rupture 04/2011    s/p repair  . HTN (hypertension)   . Diverticulitis     s/p colon surgery w/ostomy  . Depression   . Bipolar 1 disorder   . Hyperlipidemia   . Syncope and collapse      Past Surgical History  Procedure Date  . Sigmoid resection / rectopexy 2011    divericulitis  . Rotator cuff repair 2012    . Biceps tendon repair 04/2011  . Appendectomy 2011     Family History  Problem Relation Age of Onset  . Depression Mother   . Heart disease Father   . Heart disease Maternal Uncle   . Heart attack Maternal Grandmother   . Heart attack Maternal Grandfather      History   Social History  . Marital Status: Married    Spouse Name: N/A    Number of Children: 3  . Years of Education: N/A   Occupational History  . Welder/Fabricator    Social History Main Topics  . Smoking status: Current Everyday Smoker -- 1.5 packs/day for 20 years    Types: Cigarettes  . Smokeless tobacco: Never Used  . Alcohol Use: No     Occasional  . Drug Use: No  . Sexually Active: Not on file   Other Topics Concern  . Not on file   Social History Narrative   Lives in [place] with wife, 3 children 21YO,18YO,15YO. Works - Psychologist, occupational.     ROS Constitutional: Negative for fever, chills. HENT: Negative for hearing loss, nosebleeds, congestion, sore throat, facial swelling, drooling, trouble swallowing, neck pain, voice change, sinus pressure and tinnitus.  Eyes: Negative for photophobia, pain, discharge  and visual disturbance.  Respiratory: Negative for apnea, cough, chest tightness and wheezing.  Cardiovascular: Negative for chest pain, palpitations and leg swelling.  Gastrointestinal: Negative for  abdominal pain, diarrhea, constipation, blood in stool and abdominal distention.  Genitourinary: Negative for dysuria, urgency, frequency, hematuria and decreased urine volume.  Musculoskeletal: Negative for myalgias, back pain, joint swelling, arthralgias and gait problem.  Skin: Negative for color change, pallor, rash and wound.  Psychiatric/Behavioral: Negative for suicidal ideas, hallucinations, behavioral problems and agitation. The patient is nervous/anxious.     PHYSICAL EXAM   BP 129/86  Pulse 73  Ht 5\' 4"  (1.626 m)  Wt 210 lb 4 oz (95.369 kg)  BMI 36.09 kg/m2 Constitutional: He is  oriented to person, place, and time. He appears well-developed and well-nourished. No distress.  HENT: No nasal discharge.  Head: Normocephalic and atraumatic.  Eyes: Pupils are equal and round. Right eye exhibits no discharge. Left eye exhibits no discharge.  Neck: Normal range of motion. Neck supple. No JVD present. No thyromegaly present.  Cardiovascular: Normal rate, regular rhythm, normal heart sounds and. Exam reveals no gallop and no friction rub. No murmur heard.  Pulmonary/Chest: Effort normal and breath sounds normal. No stridor. No respiratory distress. He has no wheezes. He has no rales. He exhibits no tenderness.  Abdominal: Soft. Bowel sounds are normal. He exhibits no distension. There is no tenderness. There is no rebound and no guarding.  Musculoskeletal: Normal range of motion. He exhibits no edema and no tenderness.  Neurological: He is alert and oriented to person, place, and time. Coordination normal.  Skin: Skin is warm and dry. No rash noted. He is not diaphoretic. No erythema. No pallor.  Psychiatric: He has a normal mood and affect. His behavior is normal. Judgment and thought content normal.       EKG: Normal sinus rhythm.   ASSESSMENT AND PLAN

## 2011-11-05 NOTE — Assessment & Plan Note (Signed)
His hypertension seems to be very unusual with very labile blood pressure even that same day. Also his current symptoms of diaphoresis and nausea are still unexplainable. We might need to look for unusual causes of hypertension such as pheochromocytoma, hyperaldosteronism or renal artery stenosis. Thus, I recommend 24 hour urine for metanephrine as well as catecholamines. I will also check renin and aldosterone ratio. I might consider a renal artery duplex ultrasound based on the results.

## 2011-11-05 NOTE — Patient Instructions (Addendum)
Follow up in 1 month   

## 2011-11-05 NOTE — Assessment & Plan Note (Signed)
His symptoms are atypical and unlikely to the present angina equivalent. He was evaluated today with a treadmill stress test which showed no evidence of ischemia. We also could not reproduce his symptoms with exercise. His blood pressure response to exercise was normal as well.

## 2011-11-05 NOTE — Procedures (Signed)
    Treadmill Stress test  Indication: Dyspnea.  Baseline Data:  Resting EKG shows NSR with rate of 69 bpm, no significant ST or T wave changes. Resting blood pressure of 129/86 mm Hg Stand bruce protocal was used.  Exercise Data:  Patient exercised for 9 min 0 sec,  Peak heart rate of 142 bpm.  This was 78 % of the maximum predicted heart rate. No symptoms of chest pain or lightheadedness were reported at peak stress or in recovery.  Peak Blood pressure recorded was 172/72 Maximal work level: 10.1 METs.  Heart rate at 3 minutes in recovery was 80 bpm. BP response: Blunted. HR response: Normal.  EKG with Exercise: Sinus tachycardia with no significant ST or T wave changes.  FINAL IMPRESSION: Normal exercise stress test at 78% maximal predicted heart rate. No significant EKG changes concerning for ischemia. Good exercise tolerance. Normal blood pressure response to exercise.

## 2011-11-05 NOTE — Patient Instructions (Addendum)
Your physician has requested that you have an exercise tolerance test. For further information please visit [email or web address] Please also follow instruction sheet, as given.  Labs with 24 hour urine collection  Follow up in 1 month

## 2011-12-11 ENCOUNTER — Ambulatory Visit: Payer: PRIVATE HEALTH INSURANCE | Admitting: Cardiovascular Disease

## 2011-12-25 ENCOUNTER — Ambulatory Visit: Payer: PRIVATE HEALTH INSURANCE | Admitting: Cardiovascular Disease

## 2012-01-11 ENCOUNTER — Ambulatory Visit: Payer: PRIVATE HEALTH INSURANCE | Admitting: Cardiovascular Disease

## 2012-01-18 ENCOUNTER — Encounter: Payer: Self-pay | Admitting: Cardiovascular Disease

## 2012-01-22 ENCOUNTER — Ambulatory Visit: Payer: PRIVATE HEALTH INSURANCE | Admitting: Internal Medicine

## 2012-01-22 DIAGNOSIS — Z0289 Encounter for other administrative examinations: Secondary | ICD-10-CM

## 2012-02-15 ENCOUNTER — Emergency Department: Payer: Self-pay | Admitting: Unknown Physician Specialty

## 2012-03-12 ENCOUNTER — Other Ambulatory Visit: Payer: Self-pay | Admitting: Internal Medicine

## 2012-03-20 ENCOUNTER — Other Ambulatory Visit: Payer: Self-pay | Admitting: Internal Medicine

## 2012-05-17 ENCOUNTER — Other Ambulatory Visit: Payer: Self-pay | Admitting: Internal Medicine

## 2012-06-11 ENCOUNTER — Other Ambulatory Visit: Payer: Self-pay | Admitting: Internal Medicine

## 2012-08-23 ENCOUNTER — Other Ambulatory Visit: Payer: Self-pay | Admitting: Internal Medicine

## 2012-09-07 ENCOUNTER — Other Ambulatory Visit: Payer: Self-pay | Admitting: Internal Medicine

## 2012-09-09 ENCOUNTER — Telehealth: Payer: Self-pay | Admitting: *Deleted

## 2012-09-09 NOTE — Telephone Encounter (Signed)
Pt presented to office, stating he had tested positive for [facility] for a work urine drug test. He states the place that performed the screening did not ask what medications he was taking. He states he has been taking high doses of ibuprofen as well for shoulder pain. Wants to know if any of his medications would have caused this result. Verified current meds he was taking to his medication list. Also advised pt to discuss with lab that performed screening what he needs to do if he disagrees with reading or retest.

## 2012-09-09 NOTE — Telephone Encounter (Signed)
None of his medications should cause positive test for THC.

## 2012-09-09 NOTE — Telephone Encounter (Signed)
Left message for pt to return my call.

## 2012-09-12 NOTE — Telephone Encounter (Signed)
Left message to call back  

## 2012-09-13 ENCOUNTER — Encounter: Payer: Self-pay | Admitting: Internal Medicine

## 2012-09-13 ENCOUNTER — Ambulatory Visit (INDEPENDENT_AMBULATORY_CARE_PROVIDER_SITE_OTHER): Payer: BC Managed Care – PPO | Admitting: Internal Medicine

## 2012-09-13 VITALS — BP 128/78 | HR 91 | Temp 98.5°F | Wt 203.2 lb

## 2012-09-13 DIAGNOSIS — R110 Nausea: Secondary | ICD-10-CM | POA: Insufficient documentation

## 2012-09-13 DIAGNOSIS — I10 Essential (primary) hypertension: Secondary | ICD-10-CM

## 2012-09-13 LAB — CBC WITH DIFFERENTIAL/PLATELET
Basophils Relative: 0.3 % (ref 0.0–3.0)
Eosinophils Relative: 2.1 % (ref 0.0–5.0)
HCT: 42 % (ref 39.0–52.0)
Lymphs Abs: 3.2 10*3/uL (ref 0.7–4.0)
MCV: 93.4 fl (ref 78.0–100.0)
Monocytes Absolute: 1.3 10*3/uL — ABNORMAL HIGH (ref 0.1–1.0)
Monocytes Relative: 9.1 % (ref 3.0–12.0)
Neutrophils Relative %: 65.8 % (ref 43.0–77.0)
RBC: 4.49 Mil/uL (ref 4.22–5.81)
WBC: 14.1 10*3/uL — ABNORMAL HIGH (ref 4.5–10.5)

## 2012-09-13 LAB — COMPREHENSIVE METABOLIC PANEL
AST: 30 U/L (ref 0–37)
Alkaline Phosphatase: 87 U/L (ref 39–117)
BUN: 13 mg/dL (ref 6–23)
Creatinine, Ser: 0.8 mg/dL (ref 0.4–1.5)

## 2012-09-13 LAB — POCT URINALYSIS DIPSTICK
Bilirubin, UA: NEGATIVE
Glucose, UA: NEGATIVE
Ketones, UA: NEGATIVE
Leukocytes, UA: NEGATIVE
pH, UA: 7

## 2012-09-13 MED ORDER — ONDANSETRON 8 MG PO TBDP: 8.0000 mg | ORAL_TABLET | Freq: Three times a day (TID) | ORAL | Status: DC | PRN

## 2012-09-13 NOTE — Assessment & Plan Note (Signed)
BP Readings from Last 3 Encounters:  09/13/12 128/78  11/05/11 110/80  11/05/11 129/86   BP well controlled on current medications. Will check renal function with labs today.

## 2012-09-13 NOTE — Progress Notes (Signed)
Subjective:    Patient ID: Sean Herring, male    DOB: 1970/12/08, 42 y.o.   MRN: [id number]  HPI 42YO male with h/o HTN, HL, bipolar disorder presents for follow up. He reports that he is generally feeling well. He has been compliant with medication. His only concern today is chronic nausea. He reports that ever since his valve surgery, he has had chronic nausea which is worse in the morning. He occasionally gags but does not generally vomit. He denies abdominal pain. He denies blood in his stool, or other change in bowel habits such as constipation or diarrhea. The nausea does not seem to be affected by certain foods. He has not taken any medication for nausea.  Outpatient Encounter Prescriptions as of 09/13/2012  Medication Sig Dispense Refill  . amLODipine (NORVASC) 5 MG tablet TAKE 1 TABLET BY MOUTH ONCE A DAY  30 tablet  11  . atorvastatin (LIPITOR) 20 MG tablet TAKE 1 TABLET BY MOUTH ONCE A DAY  30 tablet  3  . carvedilol (COREG) 6.25 MG tablet TAKE 1 TABLET BY MOUTH TWICE A DAY WITH A MEAL  60 tablet  3  . citalopram (CELEXA) 40 MG tablet TAKE 1 TABLET BY MOUTH EVERY DAY  30 tablet  6  . gabapentin (NEURONTIN) 300 MG capsule TAKE ONE CAPSULE BY MOUTH 3 TIMES A DAY  90 capsule  3  . aspirin EC 81 MG tablet Take 81 mg by mouth daily.      . ondansetron (ZOFRAN-ODT) 8 MG disintegrating tablet Take 1 tablet (8 mg total) by mouth every 8 (eight) hours as needed for nausea.  60 tablet  0  . [DISCONTINUED] carbamazepine (TEGRETOL) 200 MG tablet Take 1 tablet (200 mg total) by mouth 2 (two) times daily.  60 tablet  6   No facility-administered encounter medications on file as of 09/13/2012.   BP 128/78  Pulse 91  Temp(Src) 98.5 F (36.9 C) (Oral)  Wt 203 lb 4 oz (92.194 kg)  BMI 34.87 kg/m2  SpO2 97%  Review of Systems  Constitutional: Negative for fever, chills, activity change, appetite change, fatigue and unexpected weight change.  Eyes: Negative for visual disturbance.   Respiratory: Negative for cough and shortness of breath.   Cardiovascular: Negative for chest pain, palpitations and leg swelling.  Gastrointestinal: Positive for nausea. Negative for vomiting, abdominal pain, diarrhea, constipation, blood in stool, abdominal distention, anal bleeding and rectal pain.  Genitourinary: Negative for dysuria, urgency and difficulty urinating.  Musculoskeletal: Negative for arthralgias and gait problem.  Skin: Negative for color change and rash.  Hematological: Negative for adenopathy.  Psychiatric/Behavioral: Negative for sleep disturbance and dysphoric mood. The patient is not nervous/anxious.        Objective:   Physical Exam  Constitutional: He is oriented to person, place, and time. He appears well-developed and well-nourished. No distress.  HENT:  Head: Normocephalic and atraumatic.  Right Ear: External ear normal.  Left Ear: External ear normal.  Nose: Nose normal.  Mouth/Throat: Oropharynx is clear and moist. No oropharyngeal exudate.  Eyes: Conjunctivae and EOM are normal. Pupils are equal, round, and reactive to light. Right eye exhibits no discharge. Left eye exhibits no discharge. No scleral icterus.  Neck: Normal range of motion. Neck supple. No tracheal deviation present. No thyromegaly present.  Cardiovascular: Normal rate, regular rhythm and normal heart sounds.  Exam reveals no gallop and no friction rub.   No murmur heard. Pulmonary/Chest: Effort normal and breath sounds normal. No respiratory distress. He  has no wheezes. He has no rales. He exhibits no tenderness.  Abdominal: Soft. Bowel sounds are normal. He exhibits no distension and no mass. There is no tenderness. There is no rebound and no guarding.  Musculoskeletal: Normal range of motion. He exhibits no edema.  Lymphadenopathy:    He has no cervical adenopathy.  Neurological: He is alert and oriented to person, place, and time. No cranial nerve deficit. Coordination normal.  Skin:  Skin is warm and dry. No rash noted. He is not diaphoretic. No erythema. No pallor.  Psychiatric: He has a normal mood and affect. His behavior is normal. Judgment and thought content normal.          Assessment & Plan:

## 2012-09-13 NOTE — Assessment & Plan Note (Signed)
Chronic daily nausea, worse in the mornings. Previous testing for H. Pylori was negative 1 year ago. Will repeat today. Will also check CBC, CMP, lipase. Will set up RUQ [place] to look for cholecystitis or pancreatitis. Will start ondansetron. Discussed referral to GI if labs and [place] unrevealing as pt may need EGD for further evaluation.

## 2012-09-14 NOTE — Telephone Encounter (Signed)
Patient never returned call  

## 2012-09-15 ENCOUNTER — Telehealth: Payer: Self-pay | Admitting: Internal Medicine

## 2012-09-15 ENCOUNTER — Ambulatory Visit: Payer: Self-pay | Admitting: Internal Medicine

## 2012-09-15 DIAGNOSIS — R110 Nausea: Secondary | ICD-10-CM

## 2012-09-15 NOTE — Telephone Encounter (Signed)
US of the abdomen was normal. I would like to set up GI evaluation to better identify cause of persistent nausea. Please make sure he is okay with this.

## 2012-09-16 NOTE — Telephone Encounter (Signed)
Informed patient wife and she would like for you to place referral for GI.

## 2012-09-16 NOTE — Telephone Encounter (Signed)
Left message to call back  

## 2012-09-19 ENCOUNTER — Encounter: Payer: Self-pay | Admitting: Internal Medicine

## 2012-10-10 ENCOUNTER — Encounter: Payer: Self-pay | Admitting: Internal Medicine

## 2012-10-13 ENCOUNTER — Ambulatory Visit: Payer: BC Managed Care – PPO | Admitting: Internal Medicine

## 2012-10-17 ENCOUNTER — Other Ambulatory Visit: Payer: Self-pay | Admitting: Internal Medicine

## 2012-10-18 ENCOUNTER — Encounter: Payer: Self-pay | Admitting: Internal Medicine

## 2012-10-25 ENCOUNTER — Ambulatory Visit: Payer: BC Managed Care – PPO | Admitting: Internal Medicine

## 2013-03-23 HISTORY — PX: ROTATOR CUFF REPAIR: SHX139

## 2013-08-12 IMAGING — US ABDOMEN ULTRASOUND LIMITED
1 series · 14 of 25 positions shown · non-contrast
Comparison: none

REASON FOR EXAM: gallbladder RUQ abd pain chronic nausea
COMMENTS:

[Series 1: abdomen ultrasound limited · 0.32mm/px · 14 of 45 slices shown]
[im 1/45]
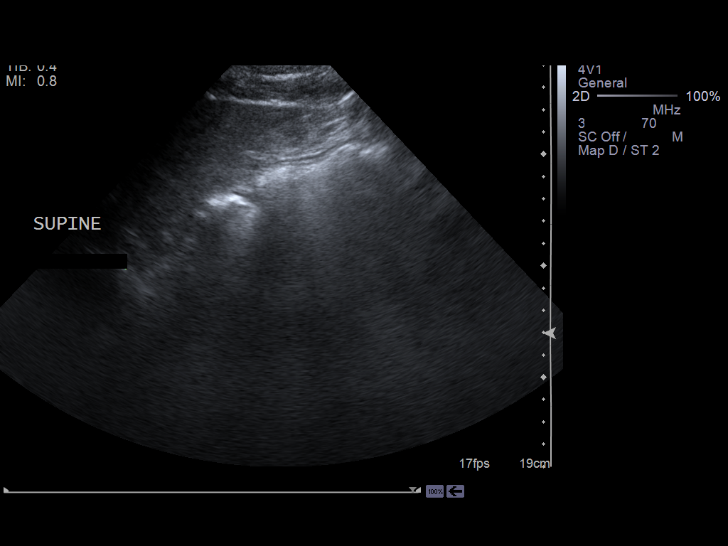
[im 4/45]
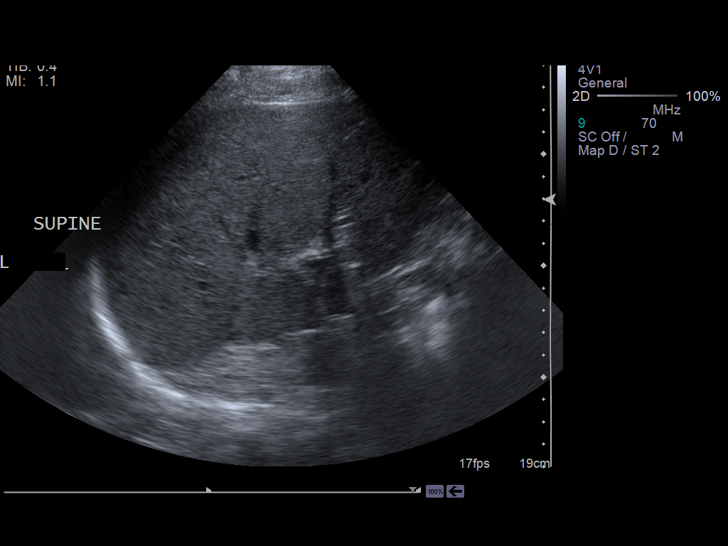
[im 8/45]
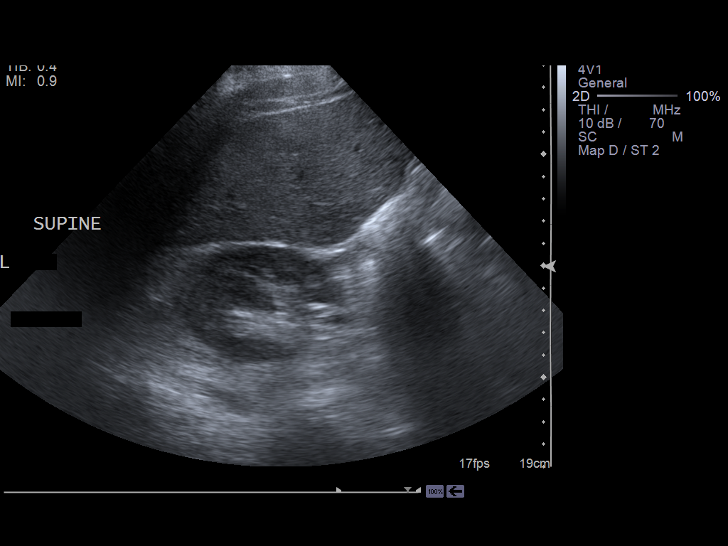
[im 12/45]
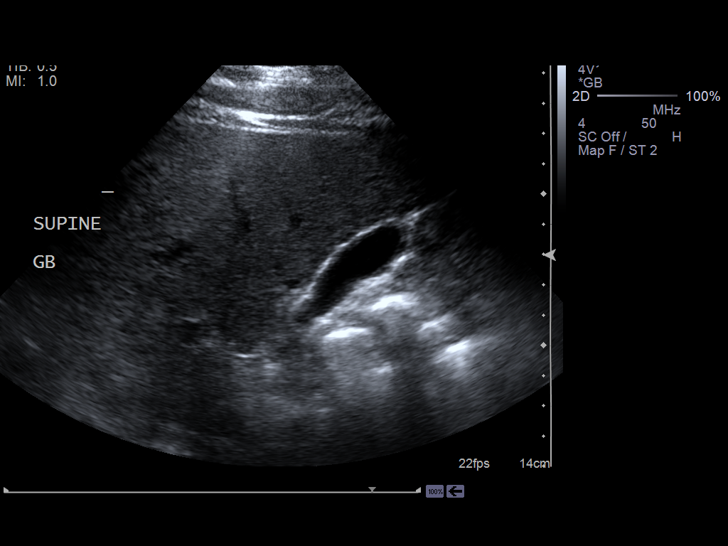
[im 15/45]
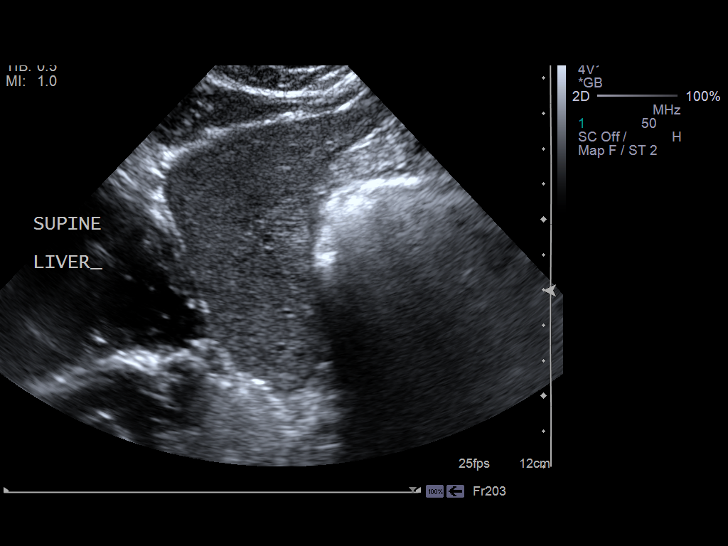
[im 17/45]
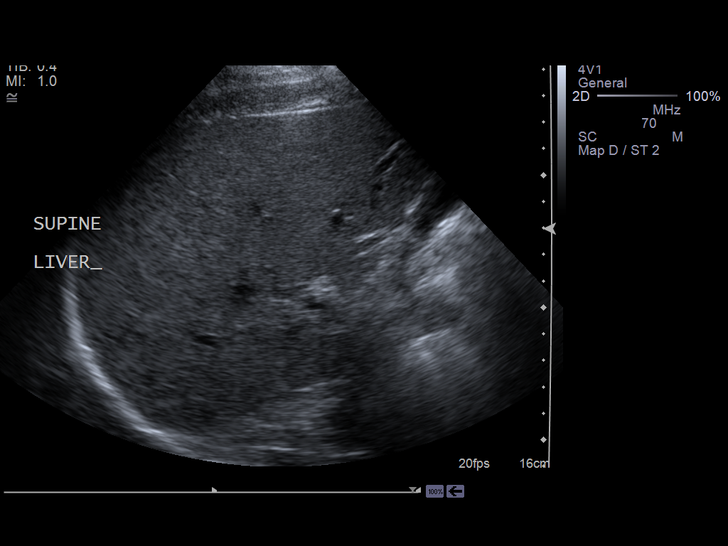
[im 21/45]
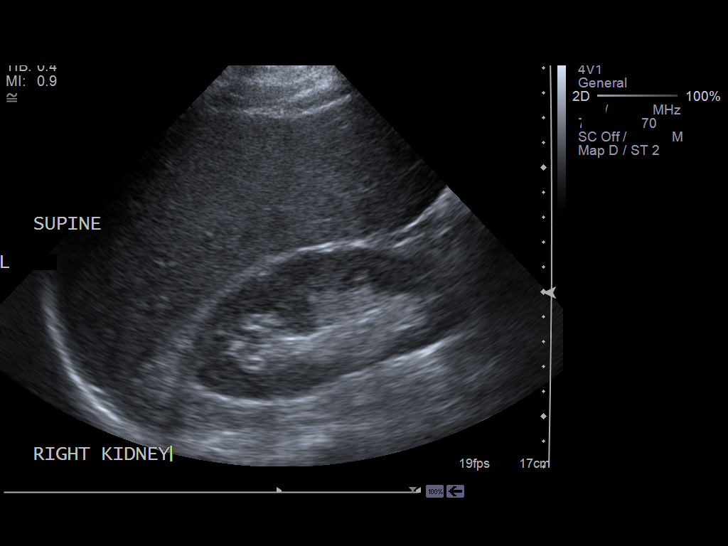
[im 24/45]
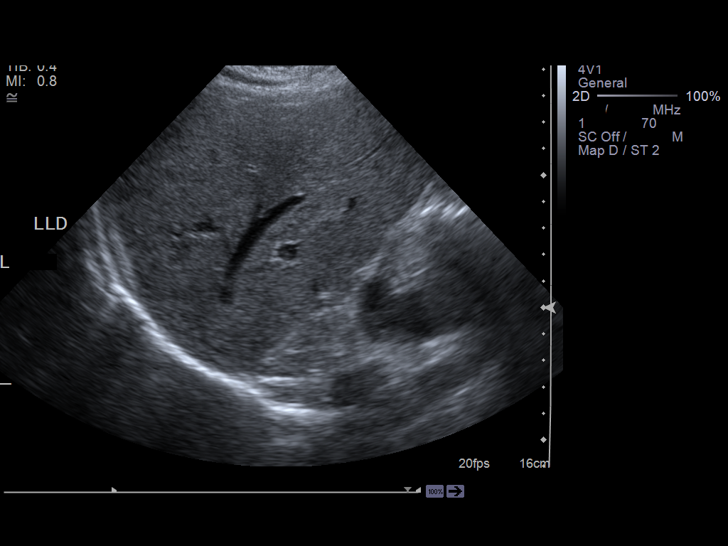
[im 28/45]
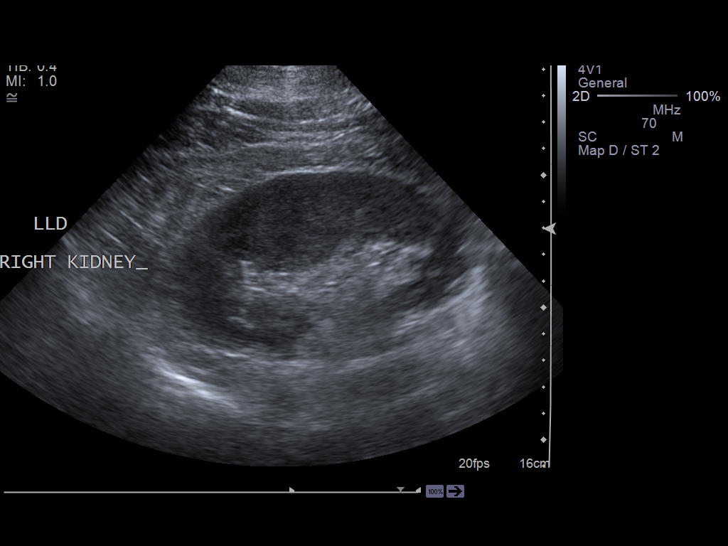
[im 30/45]
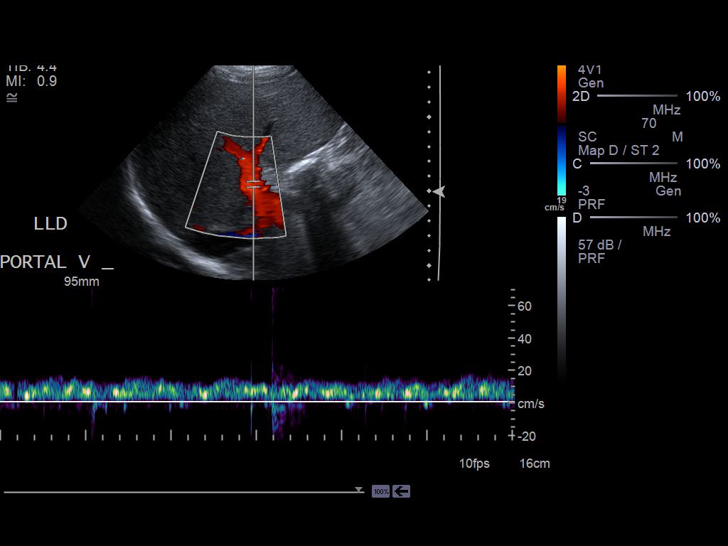
[im 34/45]
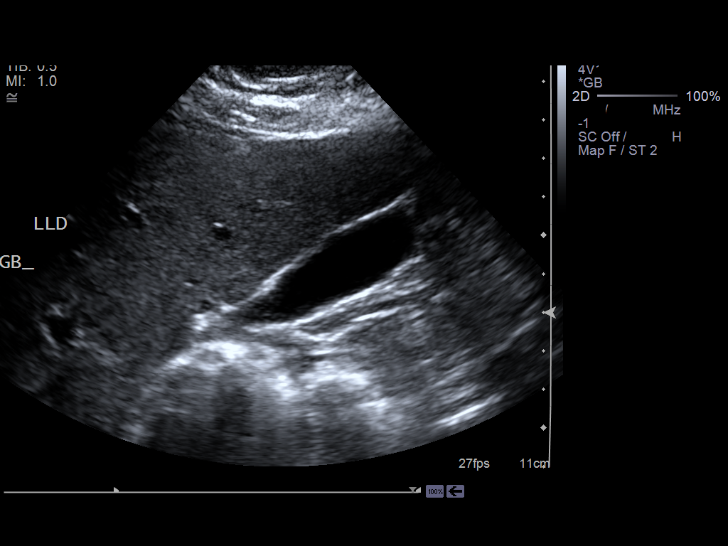
[im 37/45]
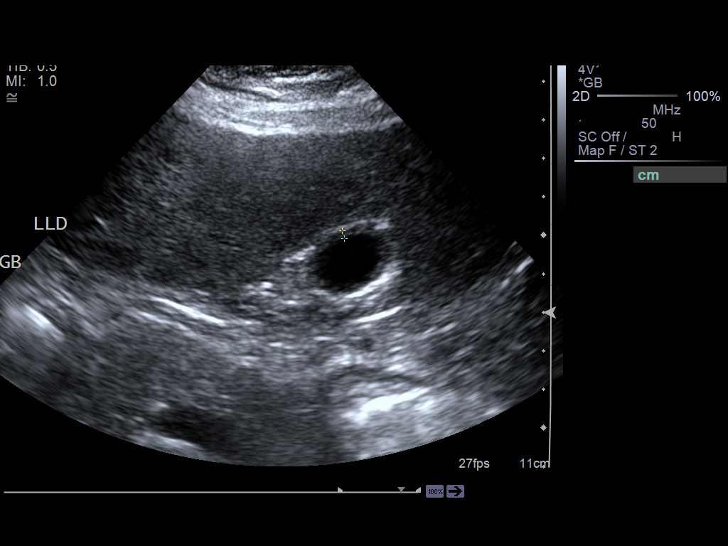
[im 41/45]
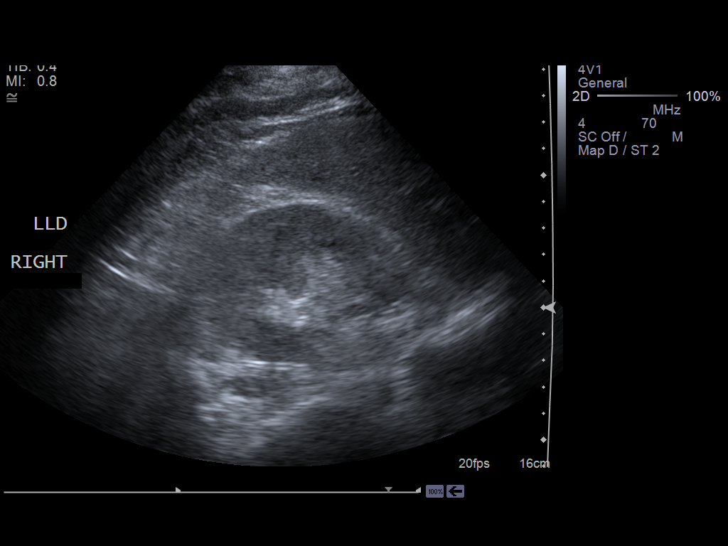
[im 45/45]
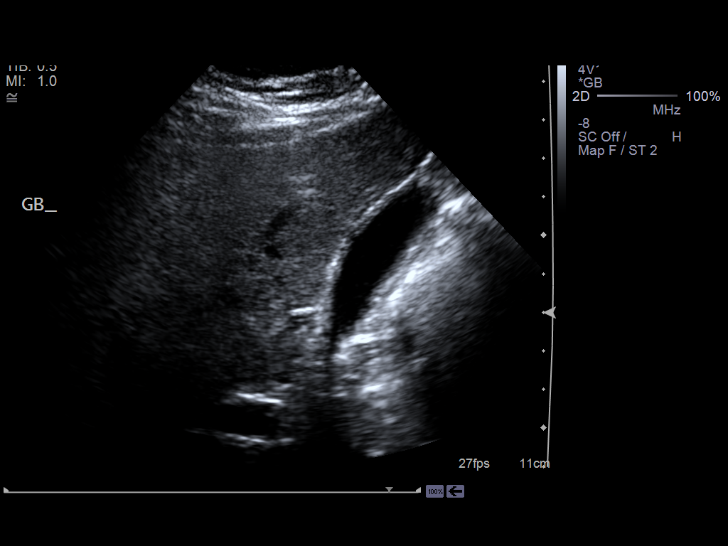

[14 of 25 positions shown; findings below may reference images not displayed]

PROCEDURE:     US  - US ABDOMEN LIMITED SURVEY  - September 15, 2012  [DATE]

RESULT:     Limited right upper quadrant abdominal sonogram is performed.
Patient has no previous study for comparison.

The pancreas is not visualized because of overlying bowel gas. The hepatic
echotexture appears normal. The liver length is 15.47 cm. Common bile duct
diameter is 4.2 mm. The portal venous flow is normal. No ascites is evident.
There is no intrahepatic biliary ductal dilation or hepatic mass. There is a
negative sonographic Murphy's sign. No cholelithiasis is evident. The
gallbladder wall thickness is 2.0 mm.
IMPRESSION: 1. Nonvisualization of the pancreas. Otherwise, normal appearing right upper
quadrant limited abdominal sonogram.

[REDACTED]

## 2013-08-24 DIAGNOSIS — R109 Unspecified abdominal pain: Secondary | ICD-10-CM

## 2013-08-24 DIAGNOSIS — G8929 Other chronic pain: Secondary | ICD-10-CM | POA: Insufficient documentation

## 2013-08-24 DIAGNOSIS — F329 Major depressive disorder, single episode, unspecified: Secondary | ICD-10-CM | POA: Insufficient documentation

## 2013-08-24 DIAGNOSIS — R5383 Other fatigue: Secondary | ICD-10-CM | POA: Insufficient documentation

## 2014-06-11 ENCOUNTER — Encounter: Payer: Self-pay | Admitting: Internal Medicine

## 2014-06-11 ENCOUNTER — Ambulatory Visit (INDEPENDENT_AMBULATORY_CARE_PROVIDER_SITE_OTHER): Payer: 59 | Admitting: Internal Medicine

## 2014-06-11 VITALS — BP 133/90 | HR 85 | Temp 98.2°F | Ht 64.0 in | Wt 218.5 lb

## 2014-06-11 DIAGNOSIS — I10 Essential (primary) hypertension: Secondary | ICD-10-CM

## 2014-06-11 DIAGNOSIS — L03319 Cellulitis of trunk, unspecified: Secondary | ICD-10-CM

## 2014-06-11 DIAGNOSIS — E785 Hyperlipidemia, unspecified: Secondary | ICD-10-CM

## 2014-06-11 MED ORDER — DOXYCYCLINE HYCLATE 100 MG PO TABS: 100.0000 mg | ORAL_TABLET | Freq: Two times a day (BID) | ORAL | Status: DC

## 2014-06-11 MED ORDER — GENTAMICIN SULFATE 0.1 % EX OINT: 1.0000 "application " | TOPICAL_OINTMENT | Freq: Two times a day (BID) | CUTANEOUS | Status: DC

## 2014-06-11 NOTE — Progress Notes (Signed)
Pre visit review using our clinic review tool, if applicable. No additional management support is needed unless otherwise documented below in the visit note. 

## 2014-06-11 NOTE — Progress Notes (Signed)
   Subjective:    Patient ID: Sean Herring, male    DOB: 08/28/1970, 44 y.o.   MRN: [id number]  HPI  44YO male presents for acute visit.  About 1 month ago, noticed some nodular cystic areas under armpits. They would open up and leak purulent fluid. More severe than previous episodes. No fever or chills. Has been cleaning with surgical soap. Tried hot compresses. No improvement. Taking Ibuprofen for pain.  HTN - pt has been lost to follow up for over 1 year. Not taking any BP medications. No CP, HA.  Past medical, surgical, family and social history per today's encounter.  Review of Systems  Constitutional: Negative for fever, chills, activity change, appetite change, fatigue and unexpected weight change.  Eyes: Negative for visual disturbance.  Respiratory: Negative for cough and shortness of breath.   Cardiovascular: Negative for chest pain, palpitations and leg swelling.  Gastrointestinal: Negative for abdominal pain and abdominal distention.  Genitourinary: Negative for dysuria, urgency and difficulty urinating.  Musculoskeletal: Negative for arthralgias and gait problem.  Skin: Positive for color change and wound. Negative for rash.  Hematological: Negative for adenopathy.  Psychiatric/Behavioral: Negative for sleep disturbance and dysphoric mood. The patient is not nervous/anxious.        Objective:    BP 133/90 mmHg  Pulse 85  Temp(Src) 98.2 F (36.8 C) (Oral)  Ht 5\' 4"  (1.626 m)  Wt 218 lb 8 oz (99.111 kg)  BMI 37.49 kg/m2  SpO2 97% Physical Exam  Constitutional: He appears well-developed and well-nourished. No distress.  HENT:  Head: Normocephalic.  Eyes: Conjunctivae and EOM are normal. Pupils are equal, round, and reactive to light.  Neck: Normal range of motion.  Pulmonary/Chest: Effort normal.  Neurological: He is alert.  Skin: Skin is warm. Rash noted. Rash is pustular. He is not diaphoretic. There is erythema.     Psychiatric: He has a normal mood  and affect. His behavior is normal. Thought content normal.          Assessment & Plan:   Problem List Items Addressed This Visit      Unprioritized   Cellulitis - Primary    Cellulitis left axilla and right groin. MRSA testing sent today. Will start Doxycycline and topical Gentamicin. Follow up recheck in 2 weeks. If persistent nodular area right groin, will need surgical evaluation.      Relevant Medications   gentamicin (GARAMYCIN) ointment 0.1%   DOXYCYCLINE HYCLATE 100 MG PO TABS   Other Relevant Orders   Nasal culture   Hyperlipidemia    Noncompliant with meds. Plan to recheck lipids and LFTs at next visit in 2 weeks.      Hypertension    BP Readings from Last 3 Encounters:  06/11/14 133/90  09/13/12 128/78  11/05/11 110/80   BP elevated. Has been off meds, noncompliant. Will plan to recheck renal function at follow up visit and recheck BP in 2 weeks. Consider starting back on Amlodipine.          Return in about 2 weeks (around 06/25/2014).

## 2014-06-11 NOTE — Patient Instructions (Signed)
Please start topical Gentamicin twice daily to left armpit and right groin.  Please start Doxycycline 100mg  twice daily to help treat infection.  Follow up recheck in 2 weeks or sooner as needed.

## 2014-06-11 NOTE — Assessment & Plan Note (Signed)
BP Readings from Last 3 Encounters:  06/11/14 133/90  09/13/12 128/78  11/05/11 110/80   BP elevated. Has been off meds, noncompliant. Will plan to recheck renal function at follow up visit and recheck BP in 2 weeks. Consider starting back on Amlodipine.

## 2014-06-11 NOTE — Assessment & Plan Note (Signed)
Noncompliant with meds. Plan to recheck lipids and LFTs at next visit in 2 weeks.

## 2014-06-11 NOTE — Assessment & Plan Note (Signed)
Cellulitis left axilla and right groin. MRSA testing sent today. Will start Doxycycline and topical Gentamicin. Follow up recheck in 2 weeks. If persistent nodular area right groin, will need surgical evaluation.

## 2014-06-12 ENCOUNTER — Telehealth: Payer: Self-pay | Admitting: Internal Medicine

## 2014-06-12 NOTE — Telephone Encounter (Signed)
emmi emailed °

## 2014-06-14 LAB — NASAL CULTURE (N/P): ORGANISM ID, BACTERIA: NORMAL

## 2014-06-26 ENCOUNTER — Ambulatory Visit (INDEPENDENT_AMBULATORY_CARE_PROVIDER_SITE_OTHER): Payer: 59 | Admitting: Internal Medicine

## 2014-06-26 ENCOUNTER — Encounter: Payer: Self-pay | Admitting: Internal Medicine

## 2014-06-26 VITALS — BP 129/83 | HR 79 | Temp 97.9°F | Ht 64.0 in | Wt 219.5 lb

## 2014-06-26 DIAGNOSIS — M25512 Pain in left shoulder: Secondary | ICD-10-CM | POA: Diagnosis not present

## 2014-06-26 DIAGNOSIS — I10 Essential (primary) hypertension: Secondary | ICD-10-CM

## 2014-06-26 DIAGNOSIS — L03319 Cellulitis of trunk, unspecified: Secondary | ICD-10-CM | POA: Diagnosis not present

## 2014-06-26 MED ORDER — DOXYCYCLINE HYCLATE 100 MG PO TABS: 100.0000 mg | ORAL_TABLET | Freq: Two times a day (BID) | ORAL | Status: DC

## 2014-06-26 NOTE — Progress Notes (Signed)
Pre visit review using our clinic review tool, if applicable. No additional management support is needed unless otherwise documented below in the visit note. 

## 2014-06-26 NOTE — Assessment & Plan Note (Signed)
Left shoulder pain. 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 Will set up sports medicine evaluation. Continue prn Ibuprofen. Follow up in 4 weeks.

## 2014-06-26 NOTE — Progress Notes (Signed)
Subjective:    Patient ID: Sean Herring, male    DOB: Sep 27, 1970, 44 y.o.   MRN: [id number]  HPI  44YO male presents for follow up.  Last seen 3/21 for cellulitis of trunk. Treated with Doxycycline and topical Gentamicin.  Continues to have some raised bumps on legs, however much improved. No fever, chills. Area under left arm is also improved. No side effects noted from doxycycline. Culture was negative for MRSA.  Left shoulder pain - aching pain top of shoulder radiating down arm over last several weeks. No weakness. Works with Engineer, materials for living. Taking Ibuprofen with minimal improvement. Had right rotator cuff tear in May 2011.   Past medical, surgical, family and social history per today's encounter.  Review of Systems  Constitutional: Negative for fever, chills, activity change, appetite change, fatigue and unexpected weight change.  Eyes: Negative for visual disturbance.  Respiratory: Negative for cough and shortness of breath.   Cardiovascular: Negative for chest pain, palpitations and leg swelling.  Gastrointestinal: Negative for abdominal pain and abdominal distention.  Genitourinary: Negative for dysuria, urgency and difficulty urinating.  Musculoskeletal: Positive for myalgias and arthralgias. Negative for gait problem.  Skin: Positive for color change and wound. Negative for rash.  Neurological: Negative for dizziness, weakness, light-headedness, numbness and headaches.  Hematological: Negative for adenopathy.  Psychiatric/Behavioral: Negative for sleep disturbance and dysphoric mood. The patient is not nervous/anxious.        Objective:    BP 129/83 mmHg  Pulse 79  Temp(Src) 97.9 F (36.6 C) (Oral)  Ht 5\' 4"  (1.626 m)  Wt 219 lb 8 oz (99.565 kg)  BMI 37.66 kg/m2  SpO2 97% Physical Exam  Constitutional: He is oriented to person, place, and time. He appears well-developed and well-nourished. No distress.  HENT:  Head: Normocephalic and atraumatic.    Right Ear: External ear normal.  Left Ear: External ear normal.  Nose: Nose normal.  Mouth/Throat: Oropharynx is clear and moist. No oropharyngeal exudate.  Eyes: Conjunctivae and EOM are normal. Pupils are equal, round, and reactive to light. Right eye exhibits no discharge. Left eye exhibits no discharge. No scleral icterus.  Neck: Normal range of motion. Neck supple. No tracheal deviation present. No thyromegaly present.  Cardiovascular: Normal rate, regular rhythm and normal heart sounds.  Exam reveals no gallop and no friction rub.   No murmur heard. Pulmonary/Chest: Effort normal and breath sounds normal. No accessory muscle usage. No tachypnea. No respiratory distress. He has no decreased breath sounds. He has no wheezes. He has no rhonchi. He has no rales. He exhibits no tenderness.  Musculoskeletal: Normal range of motion. He exhibits no edema.       Left shoulder: He exhibits tenderness (with palpation of top and posterior shoulder) and pain. He exhibits normal range of motion, no bony tenderness and normal strength.  Lymphadenopathy:    He has no cervical adenopathy.  Neurological: He is alert and oriented to person, place, and time. No cranial nerve deficit. Coordination normal.  Skin: Skin is warm and dry. Rash noted. Rash is pustular (folliculitis right groin and left axilla, improved compared to previous with no induration, fluctuance. Few small open pustules). He is not diaphoretic. No erythema. No pallor.  Psychiatric: He has a normal mood and affect. His behavior is normal. Judgment and thought content normal.          Assessment & Plan:   Problem List Items Addressed This Visit      Unprioritized   Cellulitis -  Primary    Cellulitis has improved. Will continue Doxycycline for another 10 days and continue topical Gentamicin. Follow up in 4 weeks and prn.      Relevant Medications   doxycycline (VIBRA-TABS) 100 MG tablet   Hypertension    BP Readings from Last 3  Encounters:  06/26/14 129/83  06/11/14 133/90  09/13/12 128/78   BP well controlled today. Plan to recheck renal function next visit.      Left shoulder pain    Left shoulder pain. 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 Will set up sports medicine evaluation. Continue prn Ibuprofen. Follow up in 4 weeks.      Relevant Orders   Ambulatory referral to Sports Medicine       Return in about 4 weeks (around 07/24/2014) for Recheck.

## 2014-06-26 NOTE — Patient Instructions (Signed)
We will set up evaluation with sports medicine for left shoulder pain.  Continue Doxycycline for 10 more days.  Follow up in 4 weeks.

## 2014-06-26 NOTE — Assessment & Plan Note (Signed)
Cellulitis has improved. Will continue Doxycycline for another 10 days and continue topical Gentamicin. Follow up in 4 weeks and prn.

## 2014-06-26 NOTE — Assessment & Plan Note (Signed)
BP Readings from Last 3 Encounters:  06/26/14 129/83  06/11/14 133/90  09/13/12 128/78   BP well controlled today. Plan to recheck renal function next visit.

## 2014-07-11 ENCOUNTER — Ambulatory Visit (INDEPENDENT_AMBULATORY_CARE_PROVIDER_SITE_OTHER): Payer: 59 | Admitting: Family Medicine

## 2014-07-11 ENCOUNTER — Encounter: Payer: Self-pay | Admitting: Family Medicine

## 2014-07-11 ENCOUNTER — Other Ambulatory Visit (INDEPENDENT_AMBULATORY_CARE_PROVIDER_SITE_OTHER): Payer: 59

## 2014-07-11 VITALS — BP 140/86 | HR 78 | Ht 64.0 in | Wt 220.0 lb

## 2014-07-11 DIAGNOSIS — M25512 Pain in left shoulder: Secondary | ICD-10-CM | POA: Diagnosis not present

## 2014-07-11 NOTE — Progress Notes (Signed)
[place], [place] [id number] Phone: [phone] Subjective:    I'm seeing this patient by the request  of:  Rica Mast, MD   CC: Left shoulder pain  Sean Herring:[id number] SUNG Herring is a 44 y.o. male coming in with complaint of left shoulder pain. Patient states that the pain is more on the left arm radiating down for the last several weeks. Patient does do a lot of manual activity at work. Patient has been taking over-the-counter anti-inflammatories with minimal improvement. Patient denies any weakness but states that it is so painful that sometimes it makes him stop some activity. Patient does not remember any specific injury. Patient states though that unfortunately was given a dull throbbing aching pain that is even keep him up at night. Patient rates the severity of 9 out of 10.  Past Medical History  Diagnosis Date  . Biceps tendon rupture 04/2011    s/p repair  . HTN (hypertension)   . Diverticulitis     s/p colon surgery w/ostomy  . Depression   . Bipolar 1 disorder   . Hyperlipidemia   . Syncope and collapse    Past Surgical History  Procedure Laterality Date  . Sigmoid resection / rectopexy  2011    divericulitis  . Rotator cuff repair  2012  . Biceps tendon repair  04/2011  . Appendectomy  2011   History   Social History  . Marital Status: Married    Spouse Name: N/A  . Number of Children: 3  . Years of Education: N/A   Occupational History  . Welder/Fabricator    Social History Main Topics  . Smoking status: Current Every Day Smoker -- 1.00 packs/day for 20 years    Types: Cigarettes  . Smokeless tobacco: Never Used  . Alcohol Use: No     Comment: Occasional  . Drug Use: No  . Sexual Activity: Not on file   Other Topics Concern  . Not on file   Social History Narrative   [place] in [place] wife, 3 children 21YO,18YO,15YO. Works - Building control surveyor.   Family History  Problem Relation Age  of Onset  . Depression Mother   . Heart disease Father   . Heart disease Maternal Uncle   . Heart attack Maternal Grandmother   . Heart attack Maternal Grandfather        Past medical history, social, surgical and family history all reviewed in electronic medical record.   Review of Systems: No headache, visual changes, nausea, vomiting, diarrhea, constipation, dizziness, abdominal pain, skin rash, fevers, chills, night sweats, weight loss, swollen lymph nodes, body aches, joint swelling, muscle aches, chest pain, shortness of breath, mood changes.   Objective Blood pressure 140/86, pulse 78, height 5\' 4"  (1.626 m), weight 220 lb (99.791 kg), SpO2 97 %.  General: No apparent distress alert and oriented x3 mood and affect normal, dressed appropriately.  HEENT: Pupils equal, extraocular movements intact  Respiratory: Patient's speak in full sentences and does not appear short of breath  Cardiovascular: No lower extremity edema, non tender, no erythema  Skin: Warm dry intact with no signs of infection or rash on extremities or on axial skeleton.  Abdomen: Soft nontender  Neuro: Cranial nerves II through XII are intact, neurovascularly intact in all extremities with 2+ DTRs and 2+ pulses.  Lymph: No lymphadenopathy of posterior or anterior cervical chain or axillae bilaterally.  Gait normal with good balance and coordination.  MSK:  Non  tender with full range of motion and good stability and symmetric strength and tone of  elbows, wrist, hip, knee and ankles bilaterally.  Neck: Inspection unremarkable. No palpable stepoffs. Negative Spurling's maneuver. Full neck range of motion Grip strength and sensation normal in bilateral hands Strength good C4 to T1 distribution No sensory change to C4 to T1 Negative Hoffman sign bilaterally Reflexes normal Shoulder: left Inspection reveals no abnormalities, atrophy or asymmetry. Palpation is normal with no tenderness over AC joint or bicipital  groove. ROM is full in all planes passively. Rotator cuff strength normal throughout. signs of impingement with positive Neer and Hawkin's tests, but negative empty can sign. Speeds and Yergason's tests normal. No labral pathology noted with negative Obrien's, negative clunk and good stability. Normal scapular function observed. No painful arc and no drop arm sign. No apprehension sign  MSK US performed of: left This study was ordered, performed, and interpreted by Charlann Boxer D.O.  Shoulder:   Supraspinatus:  Mild intersubstance tearing Bursal bulge seen with shoulder abduction on impingement view. Infraspinatus:  Appears normal on long and transverse views. Significant increase in Doppler flow Subscapularis: Mild degenerative changes Positive bursa Teres Minor:  Appears normal on long and transverse views. AC joint:  Mild arthritis Glenohumeral Joint:  Appears normal without effusion. Glenoid Labrum:  Intact without visualized tears. Biceps Tendon:  Appears normal on long and transverse views, no fraying of tendon, tendon located in intertubercular groove, no subluxation with shoulder internal or external rotation.  Impression: Subacromial bursitis with mild intersubstance tearing of the supraspinatus and subscapularis.  Procedure: Real-time Ultrasound Guided Injection of left glenohumeral joint Device: GE Logiq E  Ultrasound guided injection is preferred based studies that show increased duration, increased effect, greater accuracy, decreased procedural pain, increased response rate with ultrasound guided versus blind injection.  Verbal informed consent obtained.  Time-out conducted.  Noted no overlying erythema, induration, or other signs of local infection.  Skin prepped in a sterile fashion.  Local anesthesia: Topical Ethyl chloride.  With sterile technique and under real time ultrasound guidance:  Joint visualized.  23g 1  inch needle inserted posterior approach. Pictures  taken for needle placement. Patient did have injection of 2 cc of 1% lidocaine, 2 cc of 0.5% Marcaine, and 1.0 cc of Kenalog 40 mg/dL. Completed without difficulty  Pain immediately resolved suggesting accurate placement of the medication.  Advised to call if fevers/chills, erythema, induration, drainage, or persistent bleeding.  Images permanently stored and available for review in the ultrasound unit.  Impression: Technically successful ultrasound guided injection.  Procedure note 30160; 15 minutes spent for Therapeutic exercises as stated in above notes.  This included exercises focusing on stretching, strengthening, with significant focus on eccentric aspects.  Shoulder Exercises that included:  Basic scapular stabilization to include adduction and depression of scapula Scaption, focusing on proper movement and good control Internal and External rotation utilizing a theraband, with elbow tucked at side entire time Rows with theraband Proper technique shown and discussed handout in great detail with ATC.  All questions were discussed and answered.     Impression and Recommendations:     This case required medical decision making of moderate complexity.

## 2014-07-11 NOTE — Progress Notes (Signed)
Pre visit review using our clinic review tool, if applicable. No additional management support is needed unless otherwise documented below in the visit note. 

## 2014-07-11 NOTE — Assessment & Plan Note (Signed)
Adson's left shoulder pain is a combination a subacromial bursitis as well as degenerative tearing of the rotator cuff. Patient did respond very well to an injection with near complete resolution of pain. Patient does have some mild discomfort of his neck but does have full range of motion. Cervical radiculopathy is with in the differential but is highly unlikely with the response to the injection. Patient will try a short course of anti-inflammatory, icing protocol and patient did work with a X tender to learn exercises in greater detail. Patient come back again in 3 weeks for further evaluation and treatment.

## 2014-07-11 NOTE — Patient Instructions (Addendum)
Good to see you.  Ice 20 minutes 2 times daily. Usually after activity and before bed. Exercises 3 times a week.  Try the medicine 2 times daily for 6 days Can try the pennsaid twice daily topically if needed See me again in 3 weeks.
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
## 2014-07-15 NOTE — Consult Note (Signed)
Brief Consult Note: Diagnosis: Mood disorder NOS.   Comments: The patient will be admitted to BMU.  Electronic Signatures: Orson Slick (MD)  (Signed 18-Feb-13 13:04)  Authored: Brief Consult Note   Last Updated: 18-Feb-13 13:04 by Orson Slick (MD)

## 2014-08-01 ENCOUNTER — Ambulatory Visit: Payer: 59 | Admitting: Family Medicine

## 2014-08-03 ENCOUNTER — Ambulatory Visit: Payer: 59 | Admitting: Internal Medicine

## 2014-08-03 DIAGNOSIS — Z0289 Encounter for other administrative examinations: Secondary | ICD-10-CM

## 2014-10-01 ENCOUNTER — Other Ambulatory Visit (INDEPENDENT_AMBULATORY_CARE_PROVIDER_SITE_OTHER): Payer: 59

## 2014-10-01 ENCOUNTER — Ambulatory Visit (INDEPENDENT_AMBULATORY_CARE_PROVIDER_SITE_OTHER): Payer: 59 | Admitting: Family Medicine

## 2014-10-01 ENCOUNTER — Encounter: Payer: Self-pay | Admitting: Family Medicine

## 2014-10-01 VITALS — BP 114/70 | HR 81 | Wt 206.0 lb

## 2014-10-01 DIAGNOSIS — M25512 Pain in left shoulder: Secondary | ICD-10-CM | POA: Diagnosis not present

## 2014-10-01 MED ORDER — MELOXICAM 15 MG PO TABS: 15.0000 mg | ORAL_TABLET | Freq: Every day | ORAL | Status: DC

## 2014-10-01 NOTE — Progress Notes (Signed)
[name] [place], [place] [address] Phone: [phone] Subjective:    I'm seeing this patient by the request  of:  Rica Mast, MD   CC: Left shoulder pain  [id number] TEJA JUDICE is a 44 y.o. male coming in with complaint of left shoulder pain. She was seen previously and was given an injection for intersubstance tearing of the rotator cuff. Patient was doing very well until last 2 weeks. Patient stopped doing the exercises regularly as well as.doing the icing. Patient states over the course last several days it is even wake him up at night. States that it is hard to even do his daily activities as well as work. Affecting his work on a daily basis over the course last several days.  Past Medical History  Diagnosis Date  . Biceps tendon rupture 04/2011    s/p repair  . HTN (hypertension)   . Diverticulitis     s/p colon surgery w/ostomy  . Depression   . Bipolar 1 disorder   . Hyperlipidemia   . Syncope and collapse    Past Surgical History  Procedure Laterality Date  . Sigmoid resection / rectopexy  2011    divericulitis  . Rotator cuff repair  2012  . Biceps tendon repair  04/2011  . Appendectomy  2011   History   Social History  . Marital Status: Married    Spouse Name: N/A  . Number of Children: 3  . Years of Education: N/A   Occupational History  . Welder/Fabricator    Social History Main Topics  . Smoking status: Current Every Day Smoker -- 1.00 packs/day for 20 years    Types: Cigarettes  . Smokeless tobacco: Never Used  . Alcohol Use: No     Comment: Occasional  . Drug Use: No  . Sexual Activity: Not on file   Other Topics Concern  . Not on file   Social History Narrative   Lives in [place] with wife, 3 children 21YO,18YO,15YO. Works - Building control surveyor.   Family History  Problem Relation Age of Onset  . Depression Mother   . Heart disease Father   . Heart disease Maternal Uncle   . Heart  attack Maternal Grandmother   . Heart attack Maternal Grandfather        Past medical history, social, surgical and family history all reviewed in electronic medical record.   Review of Systems: No headache, visual changes, nausea, vomiting, diarrhea, constipation, dizziness, abdominal pain, skin rash, fevers, chills, night sweats, weight loss, swollen lymph nodes, body aches, joint swelling, muscle aches, chest pain, shortness of breath, mood changes.   Objective Blood pressure 114/70, pulse 81, weight 206 lb (93.441 kg), SpO2 97 %.  General: No apparent distress alert and oriented x3 mood and affect normal, dressed appropriately.  HEENT: Pupils equal, extraocular movements intact  Respiratory: Patient's speak in full sentences and does not appear short of breath  Cardiovascular: No lower extremity edema, non tender, no erythema  Skin: Warm dry intact with no signs of infection or rash on extremities or on axial skeleton.  Abdomen: Soft nontender  Neuro: Cranial nerves II through XII are intact, neurovascularly intact in all extremities with 2+ DTRs and 2+ pulses.  Lymph: No lymphadenopathy of posterior or anterior cervical chain or axillae bilaterally.  Gait normal with good balance and coordination.  MSK:  Non tender with full range of motion and good stability and symmetric strength and tone of  elbows,  wrist, hip, knee and ankles bilaterally.  Neck: Inspection unremarkable. No palpable stepoffs. Negative Spurling's maneuver. Full neck range of motion Grip strength and sensation normal in bilateral hands Strength good C4 to T1 distribution No sensory change to C4 to T1 Negative Hoffman sign bilaterally Reflexes normal Shoulder: left Inspection reveals no abnormalities, atrophy or asymmetry. Palpation is normal with no tenderness over AC joint or bicipital groove. ROM is full in all planes passively. Rotator cuff strength normal throughout. signs of impingement with positive  Neer and Hawkin's tests, but negative empty can sign. Speeds and Yergason's tests normal. No labral pathology noted with negative Obrien's, negative clunk and good stability. Normal scapular function observed. No painful arc and no drop arm sign. No apprehension sign  MSK US performed of: left This study was ordered, performed, and interpreted by Charlann Boxer D.O.  Shoulder:   Supraspinatus:  Calcific changes noted which shows interval healing. Bursal bulge seen with shoulder abduction on impingement view. Infraspinatus:  Appears normal on long and transverse views. Significant increase in Doppler flow Subscapularis: Mild degenerative changes Positive bursa Teres Minor:  Appears normal on long and transverse views. AC joint:  Mild arthritis still noted Glenohumeral Joint:  Appears normal without effusion. Glenoid Labrum:  Intact some chronic tearing that was not noticed previously. Biceps Tendon:  Appears normal on long and transverse views, no fraying of tendon, tendon located in intertubercular groove, no subluxation with shoulder internal or external rotation.  Impression: Severe subacromial bursitis with improvement in an intersubstance tearing of the rotator cuff with calcific changes. Questionable labral pathology  Procedure: Real-time Ultrasound Guided Injection of left glenohumeral joint Device: GE Logiq E  Ultrasound guided injection is preferred based studies that show increased duration, increased effect, greater accuracy, decreased procedural pain, increased response rate with ultrasound guided versus blind injection.  Verbal informed consent obtained.  Time-out conducted.  Noted no overlying erythema, induration, or other signs of local infection.  Skin prepped in a sterile fashion.  Local anesthesia: Topical Ethyl chloride.  With sterile technique and under real time ultrasound guidance:  Joint visualized.  23g 1  inch needle inserted posterior approach. Pictures taken for  needle placement. Patient did have injection of 2 cc of 1% lidocaine, 2 cc of 0.5% Marcaine, and 1.0 cc of Kenalog 40 mg/dL. Completed without difficulty  Pain immediately resolved suggesting accurate placement of the medication.  Advised to call if fevers/chills, erythema, induration, drainage, or persistent bleeding.  Images permanently stored and available for review in the ultrasound unit.  Impression: Technically successful ultrasound guided injection.     Impression and Recommendations:     This case required medical decision making of moderate complexity.

## 2014-10-01 NOTE — Patient Instructions (Addendum)
Good to see you again pennsaid pinkie amount topically 2 times daily as needed.  Ice 20 minutes 2 times daily. Usually after activity and before bed. Physical therapy will be calling you meloxicam daily for 10 days then as needed See me again in 4-6 weeks

## 2014-10-01 NOTE — Assessment & Plan Note (Signed)
I do believe that the intersubstance tearing has improved at this time but does have some calcific changes. Recurrent subacromial bursitis and patient will be sent to formal physical therapy for further workup. Patient likely will do well with this. There is a question for possible labral pathology. Continuing to have discomfort and MR arthrogram may be necessary. She'll come back 1-4 weeks for further evaluation and treatment.

## 2014-10-12 ENCOUNTER — Other Ambulatory Visit: Payer: Self-pay | Admitting: Internal Medicine

## 2014-10-12 NOTE — Telephone Encounter (Signed)
Last OV 4.5.16, last refill 3.21.16.  Please advise refill

## 2014-11-16 ENCOUNTER — Ambulatory Visit: Payer: 59 | Admitting: Family Medicine

## 2014-11-16 DIAGNOSIS — Z0289 Encounter for other administrative examinations: Secondary | ICD-10-CM

## 2014-12-25 ENCOUNTER — Ambulatory Visit (INDEPENDENT_AMBULATORY_CARE_PROVIDER_SITE_OTHER): Payer: 59 | Admitting: Family Medicine

## 2014-12-25 ENCOUNTER — Encounter: Payer: Self-pay | Admitting: Family Medicine

## 2014-12-25 VITALS — BP 136/88 | HR 86 | Ht 64.0 in | Wt 220.0 lb

## 2014-12-25 DIAGNOSIS — M25512 Pain in left shoulder: Secondary | ICD-10-CM | POA: Diagnosis not present

## 2014-12-25 MED ORDER — TRAMADOL HCL 50 MG PO TABS: 50.0000 mg | ORAL_TABLET | Freq: Two times a day (BID) | ORAL | Status: DC | PRN

## 2014-12-25 MED ORDER — TIZANIDINE HCL 4 MG PO TABS: 4.0000 mg | ORAL_TABLET | Freq: Every evening | ORAL | Status: DC

## 2014-12-25 NOTE — Patient Instructions (Addendum)
Good to see you  Ice will always be helpful.  We will get MRI to rule out labral tear Tramadol up to  2 times daily and take tylenol with it  Tanzidine up to 3 times daily as a muscle relaxer See me 1-2 days after MRI and we will discuss next step

## 2014-12-25 NOTE — Progress Notes (Signed)
Pre visit review using our clinic review tool, if applicable. No additional management support is needed unless otherwise documented below in the visit note. 

## 2014-12-25 NOTE — Progress Notes (Signed)
[facility] [place], [place] [address] Phone: [phone] Subjective:    I'm seeing this patient by the request  of:  Rica Mast, MD   CC: Left shoulder pain follow-up  [id number] Sean Herring is a 44 y.o. male coming in with complaint of left shoulder pain. Patient was seen previously and did have some intersubstance tearing of the rotator cuff as well as a subacromial bursitis with questionable labral tear. Patient was injection, home exercises, icing protocol. Patient states he is actually had worsening symptoms. States that the injection worked for approximate 4 days. Patient states since then he hasn't notice increasing weakness as well as pain with almost any type of activity. Even a dull aching pain at rest. Waking him up at night and affecting his daily activities as well as his job.  Past Medical History  Diagnosis Date  . Biceps tendon rupture 04/2011    s/p repair  . HTN (hypertension)   . Diverticulitis     s/p colon surgery w/ostomy  . Depression   . Bipolar 1 disorder ([place])   . Hyperlipidemia   . Syncope and collapse    Past Surgical History  Procedure Laterality Date  . Sigmoid resection / rectopexy  2011    divericulitis  . Rotator cuff repair  2012  . Biceps tendon repair  04/2011  . Appendectomy  2011   Social History   Social History  . Marital Status: Married    Spouse Name: N/A  . Number of Children: 3  . Years of Education: N/A   Occupational History  . Welder/Fabricator    Social History Main Topics  . Smoking status: Current Every Day Smoker -- 1.00 packs/day for 20 years    Types: Cigarettes  . Smokeless tobacco: Never Used  . Alcohol Use: No     Comment: Occasional  . Drug Use: No  . Sexual Activity: Not on file   Other Topics Concern  . Not on file   Social History Narrative   Lives in [place] with wife, 3 children 21YO,18YO,43YO. Works - Building control surveyor.   Family History    Problem Relation Age of Onset  . Depression Mother   . Heart disease Father   . Heart disease Maternal Uncle   . Heart attack Maternal Grandmother   . Heart attack Maternal Grandfather        Past medical history, social, surgical and family history all reviewed in electronic medical record.   Review of Systems: No headache, visual changes, nausea, vomiting, diarrhea, constipation, dizziness, abdominal pain, skin rash, fevers, chills, night sweats, weight loss, swollen lymph nodes, body aches, joint swelling, muscle aches, chest pain, shortness of breath, mood changes.   Objective Blood pressure 136/88, pulse 86, height 5\' 4"  (1.626 m), weight 220 lb (99.791 kg), SpO2 97 %.  General: No apparent distress alert and oriented x3 mood and affect normal, dressed appropriately.  HEENT: Pupils equal, extraocular movements intact  Respiratory: Patient's speak in full sentences and does not appear short of breath  Cardiovascular: No lower extremity edema, non tender, no erythema  Skin: Warm dry intact with no signs of infection or rash on extremities or on axial skeleton.  Abdomen: Soft nontender  Neuro: Cranial nerves II through XII are intact, neurovascularly intact in all extremities with 2+ DTRs and 2+ pulses.  Lymph: No lymphadenopathy of posterior or anterior cervical chain or axillae bilaterally.  Gait normal with good balance and coordination.  MSK:  Non tender with full range of motion and good stability and symmetric strength and tone of  elbows, wrist, hip, knee and ankles bilaterally.  Neck: Inspection unremarkable. No palpable stepoffs. Negative Spurling's maneuver. Full neck range of motion Grip strength and sensation normal in bilateral hands Strength good C4 to T1 distribution No sensory change to C4 to T1 Negative Hoffman sign bilaterally Reflexes normal Shoulder: left Inspection reveals no abnormalities, atrophy or asymmetry. Palpation is normal with no tenderness  over AC joint or bicipital groove. Decreasing range of motion lacking the last 10 of forward flexion, left and degrees of internal rotation of the last 5 of extension and external rotation. Rotator cuff strength 3 out of 5 compared to the contralateral side. signs of impingement with positive Neer and Hawkin's tests, but negative empty can sign. Speeds and Yergason's tests normal. Positive labral pathology with O'Brien's Normal scapular function observed. No painful arc and no drop arm sign. No apprehension sign       Impression and Recommendations:     This case required medical decision making of moderate complexity.

## 2014-12-25 NOTE — Assessment & Plan Note (Signed)
Patient was initially to find intersubstance tearing of the rotator cuff previously. Did not seem to improve with the conservative therapy. Patient is having worsening symptoms with weakness of the rotator cuff as well as labral symptoms. I'm concerned the patient has a labral tear and possibly a worsening rotator cuff tear. An MR arthrogram would be helpful. Patient has a labral tear would likely need surgical intervention. This is affecting his likelihood. We will see fingertips test improve and have patient come back to further evaluate and discuss management.  Spent  25 minutes with patient face-to-face and had greater than 50% of counseling including as described above in assessment and plan.

## 2014-12-26 ENCOUNTER — Other Ambulatory Visit: Payer: Self-pay | Admitting: Family Medicine

## 2014-12-26 DIAGNOSIS — Z1388 Encounter for screening for disorder due to exposure to contaminants: Secondary | ICD-10-CM

## 2015-01-09 ENCOUNTER — Other Ambulatory Visit: Payer: 59

## 2015-01-09 ENCOUNTER — Inpatient Hospital Stay: Admission: RE | Admit: 2015-01-09 | Payer: 59 | Source: Ambulatory Visit

## 2015-03-12 DIAGNOSIS — M75102 Unspecified rotator cuff tear or rupture of left shoulder, not specified as traumatic: Secondary | ICD-10-CM

## 2015-03-12 DIAGNOSIS — M12812 Other specific arthropathies, not elsewhere classified, left shoulder: Secondary | ICD-10-CM | POA: Insufficient documentation

## 2017-02-16 ENCOUNTER — Ambulatory Visit: Payer: Self-pay | Admitting: Family Medicine

## 2017-02-26 ENCOUNTER — Ambulatory Visit: Payer: 59 | Admitting: Family Medicine

## 2017-02-26 ENCOUNTER — Encounter: Payer: Self-pay | Admitting: Family Medicine

## 2017-02-26 VITALS — BP 158/97 | HR 90 | Temp 98.6°F | Resp 16 | Ht 62.0 in | Wt 182.8 lb

## 2017-02-26 DIAGNOSIS — K632 Fistula of intestine: Secondary | ICD-10-CM

## 2017-02-26 DIAGNOSIS — M545 Low back pain: Secondary | ICD-10-CM

## 2017-02-26 DIAGNOSIS — Z7689 Persons encountering health services in other specified circumstances: Secondary | ICD-10-CM | POA: Diagnosis not present

## 2017-02-26 DIAGNOSIS — G8929 Other chronic pain: Secondary | ICD-10-CM

## 2017-02-26 DIAGNOSIS — R110 Nausea: Secondary | ICD-10-CM | POA: Diagnosis not present

## 2017-02-26 DIAGNOSIS — Z87442 Personal history of urinary calculi: Secondary | ICD-10-CM | POA: Diagnosis not present

## 2017-02-26 DIAGNOSIS — K5792 Diverticulitis of intestine, part unspecified, without perforation or abscess without bleeding: Secondary | ICD-10-CM | POA: Diagnosis not present

## 2017-02-26 DIAGNOSIS — I10 Essential (primary) hypertension: Secondary | ICD-10-CM | POA: Diagnosis not present

## 2017-02-26 MED ORDER — TAMSULOSIN HCL 0.4 MG PO CAPS: 0.4000 mg | ORAL_CAPSULE | Freq: Every day | ORAL | 3 refills | Status: DC

## 2017-02-26 MED ORDER — BACLOFEN 10 MG PO TABS
5.0000 mg | ORAL_TABLET | Freq: Three times a day (TID) | ORAL | 0 refills | Status: DC | PRN
Start: 2017-02-26 — End: 2017-03-11

## 2017-02-26 MED ORDER — METRONIDAZOLE 500 MG PO TABS: 500.0000 mg | ORAL_TABLET | Freq: Two times a day (BID) | ORAL | 0 refills | Status: DC

## 2017-02-26 MED ORDER — ONDANSETRON 4 MG PO TBDP: 4.0000 mg | ORAL_TABLET | Freq: Three times a day (TID) | ORAL | 2 refills | Status: DC | PRN

## 2017-02-26 MED ORDER — CIPROFLOXACIN HCL 500 MG PO TABS: 500.0000 mg | ORAL_TABLET | Freq: Two times a day (BID) | ORAL | 0 refills | Status: DC

## 2017-02-26 NOTE — Patient Instructions (Addendum)
Thank you for coming to the clinic today.  1.  GASTROENTEROLOGY (GI)  [facility]) [place], [place] [address] Phone: [phone]  [facility]) [place] [place], [place] [address] Main: [phone]   Please schedule a Follow-up Appointment to: Return in about 6 weeks (around 04/09/2017), or if symptoms worsen or fail to improve, for Diverticulitis / K[place] STone / chronic nausea.  If you have any other questions or concerns, please feel free to call the clinic or send a message through [place]. You may also schedule an earlier appointment if necessary.  Additionally, you may be receiving a survey about your experience at our clinic within a few days to 1 week by e-mail or mail. We value your feedback.  Nobie Putnam, DO [place]

## 2017-02-26 NOTE — Progress Notes (Signed)
Subjective:    Patient ID: Sean Herring, male    DOB: 01-17-1971, 46 y.o.   MRN: [id number]  Sean Herring is a 46 y.o. male presenting on 02/26/2017 for Establish Care (lower back pain--Spontaneous nausea and throwing up, chills onset several months, Hx of diverticulitis)  Previous PCP Suzan Garibaldi in [place] ([place]), previously followed by Ronette Deter at [place]. And also spent time in [place]. Now here to re-establish.  Accompanied by his wife, Kaige Whistler who provides additional history.  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, he was treated at that time with hospitalization and antibiotics, possible surgical procedure, ultimately he states unable to remove area of diverticulitis as it was too close to small intestine/ - He has been treated in past with flare with antibiotics PRN - No longer followed by any GI or Surgery, he used to see Dr Tacey Heap at [place] - He is interested to establish care with Dr Lucilla Lame of [facility], as his wife is a patient - Has tried symptom relief with Simethicone for bloating PRN, and Ibuprofen PRN pain, he also takes Miralax for constipation especially when area of prior ostomy site feels full and bloated  History of Kidney Stones - Reports a recent diagnosis with kidney stone 1-2 month ago by prior PCP Dr Rock Nephew, he states it was dx by blood in urine and ultrasound image, I do not have access to this data currently, he says was given flomax and medicine and did not  think it passed or not sure if he actually has active stone, he has had dx nephrolithiasis in past. - Admits mid back pain bilateral, not colicky, intermittent episodes - He does feel a urinary pressure at times - Denies urinary frequency or blood in urine - Denies fevers  CHRONIC HTN: Reports history of elevated BP. Had been on meds in past. Unsure exact, awaiting records Current Meds - None active   =Lifestyle: - Diet: Limited changes or improvements - Exercise: Limited Denies CP, dyspnea, HA, edema, dizziness / lightheadedness  Health Maintenance: Due for Flu Shot, declines today despite counseling on benefits   Depression screen [facility] 2/9 02/26/2017  Decreased Interest 0  Down, Depressed, Hopeless 0  PHQ - 2 Score 0  Altered sleeping 1  Tired, decreased energy 1  Change in appetite 1  Feeling bad or failure about yourself  0  Trouble concentrating 0  Moving slowly or fidgety/restless 0  Suicidal thoughts 0  PHQ-9 Score 3  Difficult doing work/chores Not difficult at all    Past Medical History:  Diagnosis Date  . Anxiety   . Biceps tendon rupture 04/2011   s/p repair  . Bipolar 1 disorder ([place])   . Depression   . Diverticulitis    s/p colon surgery w/ostomy  . GERD (gastroesophageal reflux disease)   . HTN (hypertension)   . Hyperlipidemia   . Syncope and collapse    Past Surgical History:  Procedure Laterality Date  . APPENDECTOMY  2011  . BICEPS TENDON REPAIR  04/2011  .  ROTATOR CUFF REPAIR  2011   Right shoulder 2011, and Left shoulder 2015  . SIGMOID RESECTION / RECTOPEXY  2011   divericulitis   Social History   Socioeconomic History  . Marital status: Married    Spouse name: Not on file  . Number of children: 3  . Years of education: Not on file  . Highest education level: Not on file  Social Needs  . Financial resource strain: Not on file  . Food insecurity - worry: Not on file  . Food insecurity - inability: Not on file  . Transportation needs  - medical: Not on file  . Transportation needs - non-medical: Not on file  Occupational History  . Occupation: Personal assistant: [employer]  Tobacco Use  . Smoking status: Current Every Day Smoker    Packs/day: 1.00    Years: 30.00    Pack years: 30.00    Types: Cigarettes  . Smokeless tobacco: Never Used  Substance and Sexual Activity  . Alcohol use: No    Comment: Occasional  . Drug use: No  . Sexual activity: Not on file  Other Topics Concern  . Not on file  Social History Narrative   Lives in [place] with wife, 3 children 21YO,18YO,15YO. Works - Building control surveyor.   Family History  Problem Relation Age of Onset  . Depression Mother   . Heart disease Maternal Uncle   . Heart attack Maternal Grandmother   . Heart attack Maternal Grandfather    Current Outpatient Medications on File Prior to Visit  Medication Sig  . aspirin EC 81 MG tablet Take 81 mg by mouth daily.  . VENTOLIN HFA 108 (90 Base) MCG/ACT inhaler Inhale 2 puffs into the lungs every 4 (four) hours as needed.   No current facility-administered medications on file prior to visit.     Review of Systems  Constitutional: Negative for activity change, appetite change, chills, diaphoresis, fatigue, fever and unexpected weight change.  HENT: Negative for congestion, hearing loss and sinus pressure.   Eyes: Negative for visual disturbance.  Respiratory: Negative for apnea, cough, choking, chest tightness, shortness of breath and wheezing.   Cardiovascular: Negative for chest pain, palpitations and leg swelling.  Gastrointestinal: Positive for abdominal pain, constipation, diarrhea, nausea and vomiting. Negative for abdominal distention, anal bleeding, blood in stool and rectal pain.  Endocrine: Negative for cold intolerance and polyuria.  Genitourinary: Positive for testicular pain. Negative for decreased urine volume, difficulty urinating, dysuria, frequency, hematuria and urgency.  Musculoskeletal:  Positive for back pain. Negative for arthralgias and neck pain.  Skin: Negative for rash.  Allergic/Immunologic: Negative for environmental allergies.  Neurological: Negative for dizziness, weakness, light-headedness, numbness and headaches.  Hematological: Negative for adenopathy.  Psychiatric/Behavioral: Negative for behavioral problems, dysphoric mood and sleep disturbance. The patient is not nervous/anxious.    Per HPI unless specifically indicated above     Objective:    BP (!) 158/97   Pulse 90   Temp 98.6 F (37 C) (Oral)   Resp 16   Ht 5\' 2"  (1.575 m)   Wt 182 lb 12.8 oz (82.9 kg)   BMI 33.43 kg/m   Wt Readings from Last 3 Encounters:  02/26/17 182 lb 12.8 oz (82.9 kg)  12/25/14 220 lb (99.8 kg)  10/01/14 206 lb (93.4 kg)    Physical Exam  Constitutional: He is oriented to person, place, and time. He appears well-developed and well-nourished. No distress.  Mostly well appearing, comfortable, cooperative  HENT:  Head:  Normocephalic and atraumatic.  Mouth/Throat: Oropharynx is clear and moist.  Frontal / maxillary sinuses non-tender. Oropharynx clear without erythema, exudates, edema or asymmetry.  Eyes: Conjunctivae are normal. Right eye exhibits no discharge. Left eye exhibits no discharge.  Neck: Normal range of motion. Neck supple. No thyromegaly present.  Cardiovascular: Normal rate, regular rhythm, normal heart sounds and intact distal pulses.  No murmur heard. Pulmonary/Chest: Effort normal and breath sounds normal. No respiratory distress. He has no wheezes. He has no rales.  Abdominal: Soft. He exhibits no distension and no mass. There is tenderness (Mild generalized discomfort on deeper palpation lower quadrant LLQ). There is no rebound and no guarding.  Old residual midline abdominal scar. Well healed with palpable and obvious herniation vs diastasis recti on sitting up position  Left LQ former ostomy site with palpable healed scar and similar herniation  protrusion on valsalva and sitting up position, non tender  Musculoskeletal: Normal range of motion. He exhibits no edema.  Lymphadenopathy:    He has no cervical adenopathy.  Neurological: He is alert and oriented to person, place, and time.  Skin: Skin is warm and dry. No rash noted. He is not diaphoretic. No erythema.  Psychiatric: He has a normal mood and affect. His behavior is normal.  Well groomed, good eye contact, normal speech and thoughts. Slightly anxious appearing.  Nursing note and vitals reviewed.  Results for orders placed or performed in visit on 06/11/14  Nasal culture  Result Value Ref Range   Organism ID, Bacteria NORMAL NASOPHARYNGEAL FLORA       Assessment & Plan:   Problem List Items Addressed This Visit    Chronic nausea   Relevant Medications   ondansetron (ZOFRAN ODT) 4 MG disintegrating tablet   Diverticulitis - Primary   Relevant Medications   ciprofloxacin (CIPRO) 500 MG tablet   metroNIDAZOLE (FLAGYL) 500 MG tablet   History of kidney stones   Relevant Medications   baclofen (LIORESAL) 10 MG tablet   tamsulosin (FLOMAX) 0.4 MG CAPS capsule    Other Visit Diagnoses    Chronic bilateral low back pain without sciatica       Relevant Medications   baclofen (LIORESAL) 10 MG tablet      Constellation of symptoms, seem subacute on chronic worsening of known complex GI problems with presumed diverticulitis based on history and exam, however without rectal bleeding. Since patient is new to me and still waiting on outside records, will go on his report and provide empiric therapy today since appears mild to moderate without hemodynamic instability or acute evidence of infection - Uncertain if kidney stone, sounds less suggestive of correct diagnosis by history, do not have results  Plan: 1. Empiric trial Cipro and Flagyl BID x 7 days for coverage of diverticulitis and possible UTI with history of possible kidney stone 2. Rx Zofran ODT 4mg  PRN nausea for  symptom relief 3. Continue Simethicone, Miralax PRN 4. Also will refill rx Tamsulosin for possible stone, and he may have BPH as well 5. Rx Baclofen PRN muscle relaxant for back pain and may help both abdomen and possible colick if stone 6. Strict return criteria if worsening - next steps would be imaging likely CT w/ and w/o contrast abdomen / pelvis, or if worse may go to hospital ED, limited options end of day on Friday, also concern with upcoming weather, patient should seek care more immediately if needed  Referral to [facility] Dr Allen Norris for further evaluation and management of possible diverticulitis among  other chronic GI complaints, ultimately he may need second opinion from general surgery if he has problem with former ostomy site. Also would consider potential dx IBD if he has chronic inflammatory condition.  Lastly may consider referral to Urology for nephrolithiasis management if persistent problems, or if identified significant stone burden on future imaging if not improved   Meds ordered this encounter  Medications  . ciprofloxacin (CIPRO) 500 MG tablet    Sig: Take 1 tablet (500 mg total) by mouth 2 (two) times daily.    Dispense:  14 tablet    Refill:  0    For 7 days  . metroNIDAZOLE (FLAGYL) 500 MG tablet    Sig: Take 1 tablet (500 mg total) by mouth 2 (two) times daily. Do not drink alcohol while taking this medicine.    Dispense:  14 tablet    Refill:  0    For 7 days  . ondansetron (ZOFRAN ODT) 4 MG disintegrating tablet    Sig: Take 1 tablet (4 mg total) by mouth every 8 (eight) hours as needed for nausea or vomiting.    Dispense:  30 tablet    Refill:  2  . baclofen (LIORESAL) 10 MG tablet    Sig: Take 0.5-1 tablets (5-10 mg total) by mouth 3 (three) times daily as needed for muscle spasms.    Dispense:  30 each    Refill:  0  . tamsulosin (FLOMAX) 0.4 MG CAPS capsule    Sig: Take 1 capsule (0.4 mg total) by mouth daily.    Dispense:  30 capsule    Refill:  3     Follow up plan: Return in about 6 weeks (around 04/09/2017), or if symptoms worsen or fail to improve, for Diverticulitis / KIdney STone / chronic nausea.  Nobie Putnam, [facility] 02/27/2017, 9:44 AM

## 2017-02-27 ENCOUNTER — Encounter: Payer: Self-pay | Admitting: Family Medicine

## 2017-03-11 ENCOUNTER — Ambulatory Visit (INDEPENDENT_AMBULATORY_CARE_PROVIDER_SITE_OTHER): Payer: 59 | Admitting: Gastroenterology

## 2017-03-11 ENCOUNTER — Other Ambulatory Visit
Admission: RE | Admit: 2017-03-11 | Discharge: 2017-03-11 | Disposition: A | Discharge status: Home / Self Care | Payer: 59 | Source: Ambulatory Visit | Attending: Gastroenterology | Admitting: Gastroenterology

## 2017-03-11 ENCOUNTER — Telehealth: Payer: Self-pay

## 2017-03-11 ENCOUNTER — Encounter: Payer: Self-pay | Admitting: Gastroenterology

## 2017-03-11 VITALS — BP 136/79 | HR 103 | Ht 62.0 in | Wt 181.8 lb

## 2017-03-11 DIAGNOSIS — R109 Unspecified abdominal pain: Secondary | ICD-10-CM | POA: Insufficient documentation

## 2017-03-11 DIAGNOSIS — R140 Abdominal distension (gaseous): Secondary | ICD-10-CM | POA: Insufficient documentation

## 2017-03-11 LAB — COMPREHENSIVE METABOLIC PANEL
ALT: 48 U/L (ref 17–63)
AST: 42 U/L — AB (ref 15–41)
Albumin: 4.3 g/dL (ref 3.5–5.0)
Alkaline Phosphatase: 85 U/L (ref 38–126)
Anion gap: 10 (ref 5–15)
BILIRUBIN TOTAL: 0.3 mg/dL (ref 0.3–1.2)
BUN: 14 mg/dL (ref 6–20)
CO2: 21 mmol/L — ABNORMAL LOW (ref 22–32)
Calcium: 9.1 mg/dL (ref 8.9–10.3)
Chloride: 106 mmol/L (ref 101–111)
Creatinine, Ser: 0.69 mg/dL (ref 0.61–1.24)
Glucose, Bld: 101 mg/dL — ABNORMAL HIGH (ref 65–99)
POTASSIUM: 4.1 mmol/L (ref 3.5–5.1)
Sodium: 137 mmol/L (ref 135–145)
TOTAL PROTEIN: 7.4 g/dL (ref 6.5–8.1)

## 2017-03-11 LAB — CBC WITH DIFFERENTIAL/PLATELET
BASOS ABS: 0.1 10*3/uL (ref 0–0.1)
Basophils Relative: 1 %
EOS PCT: 2 %
Eosinophils Absolute: 0.2 10*3/uL (ref 0–0.7)
HEMATOCRIT: 42.1 % (ref 40.0–52.0)
Hemoglobin: 14.3 g/dL (ref 13.0–18.0)
LYMPHS ABS: 2.7 10*3/uL (ref 1.0–3.6)
LYMPHS PCT: 20 %
MCH: 31 pg (ref 26.0–34.0)
MCHC: 34 g/dL (ref 32.0–36.0)
MCV: 91.1 fL (ref 80.0–100.0)
MONO ABS: 1.1 10*3/uL — AB (ref 0.2–1.0)
MONOS PCT: 8 %
NEUTROS ABS: 9.7 10*3/uL — AB (ref 1.4–6.5)
Neutrophils Relative %: 71 %
PLATELETS: 297 10*3/uL (ref 150–440)
RBC: 4.62 MIL/uL (ref 4.40–5.90)
RDW: 14.2 % (ref 11.5–14.5)
WBC: 13.8 10*3/uL — ABNORMAL HIGH (ref 3.8–10.6)

## 2017-03-11 LAB — TSH: TSH: 0.434 u[IU]/mL (ref 0.350–4.500)

## 2017-03-11 NOTE — Patient Instructions (Signed)
During your today's office visit with Dr. Vicente Males he has advised the following:  1. CT Abd Pelvis (Nurse will call with appt)  2. Report to [facility] for labs to be drawn.

## 2017-03-11 NOTE — Addendum Note (Signed)
Addended by: Vanetta Mulders on: 03/11/2017 01:43 PM   Modules accepted: Orders

## 2017-03-11 NOTE — Progress Notes (Signed)
Jonathon Bellows MD, MRCP(U.K) [address]  [place]  [place], [place] [address]  Main: [phone]  Fax: [phone]   Gastroenterology Consultation  Referring Provider:     Nobie Putnam * Primary Care Physician:  Olin Hauser, DO Primary Gastroenterologist:  Dr. Jonathon Bellows  Reason for Consultation:     Diverticulitis        HPI:   Sean Herring is a 46 y.o. y/o male referred for consultation & management  by Dr. Parks Ranger, Devonne Doughty, DO.    He has been referred for diverticulitis. He had an emergency colostomy for diverticulitis 8 years back and reversed in 2008 . 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 Treated with antibiotics. Did not have any further surgery. 30 pack years of smoking . He was commenced on a course of ciprofloixacin and metronidazole on 02/26/17 by Olin Hauser, DO . Last colonoscopy in 2011 after the stoma was taken out- didn't recall any gross abnormality. No family history of colon cancer or polyps.   He says he was having some nausea for a while -couple of years. Last 3-4 months change in bowel habits - feels constipated.Took some miralax. RLQ pain. The pain in the right side is new, feels like a cramp, not like gas. Last 3 months has had two rounds of antibiotics. Lots of fatigue.      Past Medical History:  Diagnosis Date  . Anxiety   . Biceps tendon rupture 04/2011   s/p repair  . Bipolar 1 disorder ([place])   . Depression   . Diverticulitis    s/p colon surgery w/ostomy  . GERD (gastroesophageal reflux disease)   . HTN (hypertension)   . Hyperlipidemia   . Syncope and collapse     Past Surgical History:  Procedure Laterality Date  . APPENDECTOMY  2011  . BICEPS TENDON REPAIR  04/2011  . ROTATOR CUFF REPAIR  2011   Right shoulder 2011, and Left shoulder 2015  . SIGMOID RESECTION / RECTOPEXY  2011   divericulitis    Prior to Admission  medications   Medication Sig Start Date End Date Taking? Authorizing Provider  aspirin EC 81 MG tablet Take 81 mg by mouth daily.    [provider]  baclofen (LIORESAL) 10 MG tablet Take 0.5-1 tablets (5-10 mg total) by mouth 3 (three) times daily as needed for muscle spasms. 02/26/17   Karamalegos, Devonne Doughty, DO  ciprofloxacin (CIPRO) 500 MG tablet Take 1 tablet (500 mg total) by mouth 2 (two) times daily. 02/26/17   Karamalegos, Devonne Doughty, DO  metroNIDAZOLE (FLAGYL) 500 MG tablet Take 1 tablet (500 mg total) by mouth 2 (two) times daily. Do not drink alcohol while taking this medicine. 02/26/17   Karamalegos, Devonne Doughty, DO  ondansetron (ZOFRAN ODT) 4 MG disintegrating tablet Take 1 tablet (4 mg total) by mouth every 8 (eight) hours as needed for nausea or vomiting. 02/26/17   Karamalegos, Devonne Doughty, DO  tamsulosin (FLOMAX) 0.4 MG CAPS capsule Take 1 capsule (0.4 mg total) by mouth daily. 02/26/17   Karamalegos, Devonne Doughty, DO  VENTOLIN HFA 108 (90 Base) MCG/ACT inhaler Inhale 2 puffs into the lungs every 4 (four) hours as needed. 02/01/17   [provider]    Family History  Problem Relation Age of Onset  . Depression Mother   . Heart disease Maternal Uncle   . Heart attack Maternal Grandmother   . Heart attack  Maternal Grandfather      Social History   Tobacco Use  . Smoking status: Current Every Day Smoker    Packs/day: 1.00    Years: 30.00    Pack years: 30.00    Types: Cigarettes  . Smokeless tobacco: Never Used  Substance Use Topics  . Alcohol use: No    Comment: Occasional  . Drug use: No    Allergies as of 03/11/2017  . (No Known Allergies)    Review of Systems:    All systems reviewed and negative except where noted in HPI.   Physical Exam:  There were no vitals taken for this visit. No LMP for male patient. Psych:  Alert and cooperative. Normal mood and affect. General:   Alert,  Well-developed, well-nourished, pleasant and cooperative in  NAD Head:  Normocephalic and atraumatic. Eyes:  Sclera clear, no icterus.   Conjunctiva pink. Ears:  Normal auditory acuity. Nose:  No deformity, discharge, or lesions. Mouth:  No deformity or lesions,oropharynx pink & moist. Neck:  Supple; no masses or thyromegaly. Lungs:  Respirations even and unlabored.  Clear throughout to auscultation.   No wheezes, crackles, or rhonchi. No acute distress. Heart:  Regular rate and rhythm; no murmurs, clicks, rubs, or gallops. Abdomen:Large central abdominal scar   Normal bowel sounds.  No bruits.  Soft, non-tender and non-distended without masses, hepatosplenomegaly or hernias noted.  No guarding or rebound tenderness.    Neurologic:  Alert and oriented x3;  grossly normal neurologically. Skin:  Intact without significant lesions or rashes. No jaundice. Lymph Nodes:  No significant cervical adenopathy. Psych:  Alert and cooperative. Normal mood and affect.  Imaging Studies: No results found.  Assessment and Plan:   Sean Herring is a 46 y.o. y/o male has been referred for diverticulitis. Recent course of antibiotics. Still has some residual discomfort which could be from the diverticulitis or from bloating due to scar tissue.   Plan  1. CT scan of the abdomen  2. CBC,CMP,TSH 3. Stop smoking  4. IF CT scan of the abdomen shows no active diverticulitis will need colonoscopy in mid to end February.   Follow up in 4 weeks   Dr Jonathon Bellows MD,MRCP(U.K)

## 2017-03-11 NOTE — Telephone Encounter (Signed)
LVM to inform patient of CT Abd/Pel scheduled for January 3rd at [facility] check in at 3:15pm.  No solid foods 4 hours prior only liquids.  Stop by radiology desk the day before to pick up contrast for CT.  Thank you. Sharyn Lull

## 2017-03-25 ENCOUNTER — Ambulatory Visit
Admission: RE | Admit: 2017-03-25 | Discharge: 2017-03-25 | Disposition: A | Discharge status: Home / Self Care | Payer: 59 | Source: Ambulatory Visit | Attending: Gastroenterology | Admitting: Gastroenterology

## 2017-03-25 DIAGNOSIS — R140 Abdominal distension (gaseous): Secondary | ICD-10-CM | POA: Diagnosis not present

## 2017-03-25 DIAGNOSIS — R109 Unspecified abdominal pain: Secondary | ICD-10-CM | POA: Insufficient documentation

## 2017-03-25 DIAGNOSIS — K439 Ventral hernia without obstruction or gangrene: Secondary | ICD-10-CM | POA: Insufficient documentation

## 2017-03-25 MED ORDER — IOPAMIDOL (ISOVUE-300) INJECTION 61%
100.0000 mL | Freq: Once | INTRAVENOUS | Status: AC | PRN
  Administered 2017-03-25: 100 mL via INTRAVENOUS

## 2017-03-26 ENCOUNTER — Telehealth: Payer: Self-pay

## 2017-03-26 NOTE — Telephone Encounter (Signed)
LVM for patient callback for results per Dr. Vicente Males.    - inform he has no active diverticulitis at this time. Should proceed with colonoscopy as planned mid to end of feb 2019

## 2017-03-31 ENCOUNTER — Telehealth: Payer: Self-pay | Admitting: Gastroenterology

## 2017-03-31 NOTE — Telephone Encounter (Signed)
Please call patient back for CT results.

## 2017-04-02 ENCOUNTER — Telehealth: Payer: Self-pay

## 2017-04-02 MED ORDER — DICYCLOMINE HCL 20 MG PO TABS: 20.0000 mg | ORAL_TABLET | Freq: Three times a day (TID) | ORAL | 0 refills | Status: DC

## 2017-04-02 NOTE — Telephone Encounter (Signed)
LVM for patient or spouse to callback.    -      Can try bentyl 20 mg PRN upto TID. If in severe pain in the interim needs to see PCP or urgent care or ER

## 2017-04-12 ENCOUNTER — Ambulatory Visit: Payer: 59 | Admitting: Gastroenterology

## 2017-04-28 ENCOUNTER — Encounter: Payer: Self-pay | Admitting: Nurse Practitioner

## 2017-04-28 ENCOUNTER — Other Ambulatory Visit: Payer: Self-pay

## 2017-04-28 ENCOUNTER — Ambulatory Visit: Payer: 59 | Admitting: Nurse Practitioner

## 2017-04-28 VITALS — BP 129/83 | HR 85 | Temp 99.2°F | Resp 18 | Ht 62.0 in | Wt 182.0 lb

## 2017-04-28 DIAGNOSIS — R062 Wheezing: Secondary | ICD-10-CM

## 2017-04-28 DIAGNOSIS — J101 Influenza due to other identified influenza virus with other respiratory manifestations: Secondary | ICD-10-CM | POA: Diagnosis not present

## 2017-04-28 DIAGNOSIS — R509 Fever, unspecified: Secondary | ICD-10-CM | POA: Diagnosis not present

## 2017-04-28 LAB — POCT INFLUENZA A/B
Influenza A, POC: POSITIVE — AB
Influenza B, POC: NEGATIVE

## 2017-04-28 MED ORDER — OSELTAMIVIR PHOSPHATE 75 MG PO CAPS: 75.0000 mg | ORAL_CAPSULE | Freq: Two times a day (BID) | ORAL | 0 refills | Status: AC

## 2017-04-28 MED ORDER — BENZONATATE 100 MG PO CAPS: 100.0000 mg | ORAL_CAPSULE | Freq: Three times a day (TID) | ORAL | 0 refills | Status: DC | PRN

## 2017-04-28 MED ORDER — ALBUTEROL SULFATE HFA 108 (90 BASE) MCG/ACT IN AERS: 1.0000 | INHALATION_SPRAY | Freq: Four times a day (QID) | RESPIRATORY_TRACT | 5 refills | Status: DC | PRN

## 2017-04-28 MED ORDER — IPRATROPIUM BROMIDE 0.06 % NA SOLN: 2.0000 | Freq: Four times a day (QID) | NASAL | 0 refills | Status: DC

## 2017-04-28 NOTE — Patient Instructions (Addendum)
Sean Herring, Thank you for coming in to clinic today.  1.  You tested positive for Influenza A today (rapid flu swab test in office) - Start Tamiflu (anti-flu medicine) take one capsule 75mg  twice a day for 5 days - Wash hands and cover cough very well to avoid spread of infection - For symptom control:      - Take Ibuprofen 400-600mg  every 6-8 hours as needed for fever / muscle aches.  You may also take Tylenol 500-1000mg  per dose every 6-8 hours or 3 times a day.  You can alternate dosing and take both in the same day.      - Do not take more than 3,000 mg acetaminophen (Tylenol) in a day.      - Start Tessalon perles one every 8 hours or 3 times a day as needed for cough.      - Start Atrovent nasal spray decongestant 2 sprays in each nostril up to 4 times daily for 7 days.      - Start OTC Mucinex-DM for cough and congestion for up to 7 days. - Improve hydration with plenty of clear fluids.  Drink up to 8 glasses of water / fluids each day.  If significant worsening with poor fluid intake, worsening fever, difficulty breathing due to coughing, worsening body aches, weakness, or other more concerning symptoms difficulty breathing you can seek treatment at Emergency Department. If your flu symptoms have improved and then get worse several days to a week later with concerns for bronchitis, productive cough, fever, and chills we may need to check for possible pneumonia that can occur after the flu.  Please schedule a follow-up appointment with [name], AGNP in 1-2 weeks as needed if worsening from Flu / Bronchitis   If you have any other questions or concerns, please feel free to call the clinic or send a message through [place]. You may also schedule an earlier appointment if necessary.  You will receive a survey after today's visit either digitally by e-mail or paper by [employer]. Your experiences and feedback matter to [place].  Please respond so we know how we are doing as we provide care  for you.   [name], DNP, AGNP-BC Adult Gerontology Nurse Practitioner [place]

## 2017-04-28 NOTE — Progress Notes (Signed)
Subjective:    Patient ID: Sean Herring, male    DOB: 1970-06-19, 47 y.o.   MRN: [id number]  Sean Herring is a 47 y.o. male presenting on 04/28/2017 for Influenza (bodyaches, coughing, chills x 2 days ago )   HPI Flu Dry hacking cough started Mon evening <48 hours ago.  Also has body aches head to toe, chills, sweats, rhinorrhea, malaise. - no nausea, vomiting, or diarrhea. - No known positive exposures to sick contacts with flu. - Has not taken anything other than OTC cold and flu medicine and ibuprofen and is only partially relieving symptoms.  Social History   Tobacco Use  . Smoking status: Current Every Day Smoker    Packs/day: 1.00    Years: 30.00    Pack years: 30.00    Types: Cigarettes  . Smokeless tobacco: Never Used  Substance Use Topics  . Alcohol use: No    Comment: Occasional  . Drug use: No    Review of Systems Per HPI unless specifically indicated above     Objective:    BP 129/83 (BP Location: Right Arm, Patient Position: Sitting, Cuff Size: Normal)   Pulse 85   Temp 99.2 F (37.3 C) (Oral)   Resp 18   Ht 5\' 2"  (1.575 m)   Wt 182 lb (82.6 kg)   BMI 33.29 kg/m   Wt Readings from Last 3 Encounters:  04/28/17 182 lb (82.6 kg)  03/11/17 181 lb 12.8 oz (82.5 kg)  02/26/17 182 lb 12.8 oz (82.9 kg)    Physical Exam  Constitutional: He is oriented to person, place, and time. He appears well-developed and well-nourished. He appears distressed (moderately).  HENT:  Head: Normocephalic and atraumatic.  Right Ear: Hearing, tympanic membrane, external ear and ear canal normal.  Left Ear: Hearing, tympanic membrane, external ear and ear canal normal.  Nose: Mucosal edema and rhinorrhea present. Right sinus exhibits no maxillary sinus tenderness and no frontal sinus tenderness. Left sinus exhibits no maxillary sinus tenderness and no frontal sinus tenderness.  Mouth/Throat: Uvula is midline and mucous membranes are normal. Posterior oropharyngeal  edema (cobblestoning) and posterior oropharyngeal erythema present. No tonsillar abscesses. Oropharyngeal exudate: clear secretions.  Neck: Normal range of motion. Neck supple.  Cardiovascular: Normal rate, regular rhythm, S1 normal, S2 normal, normal heart sounds and intact distal pulses.  Pulmonary/Chest: Effort normal. No respiratory distress. He has wheezes (throughout all lobes at end expiration).  Lymphadenopathy:    He has cervical adenopathy.  Neurological: He is alert and oriented to person, place, and time.  Skin: Skin is warm and dry.  Psychiatric: He has a normal mood and affect. His behavior is normal.  Vitals reviewed.    Results for orders placed or performed in visit on 04/28/17  POCT Influenza A/B  Result Value Ref Range   Influenza A, POC Positive (A) Negative   Influenza B, POC Negative Negative      Assessment & Plan:   Problem List Items Addressed This Visit    None    Visit Diagnoses    Influenza A    -  Primary   Relevant Medications   benzonatate (TESSALON) 100 MG capsule   ipratropium (ATROVENT) 0.06 % nasal spray   Chills with fever       Relevant Orders   POCT Influenza A/B (Completed)   Wheezing       Relevant Medications   albuterol (PROVENTIL HFA;VENTOLIN HFA) 108 (90 Base) MCG/ACT inhaler     Clinically consistent  with flu and confirmed Influenza A today on rapid flu test, with no known sick contact positive flu. - Duration x < 48 hours, without complication. Tolerating PO and well hydrated - No other focal findings of infection today - Did not receive influenza vaccine this season  Plan: 1. Start Tamiflu 75mg  capsules BID x 5 days 2. Supportive care as advised with NSAID / Tylenol PRN fever/myalgias, improve hydration, may take OTC Cold/Flu meds 3. START albuterol 1-2 puffs every 4 hours as needed for wheezing and shortness of breath. 3. Return criteria given if significant worsening, consider post-influenza complications, otherwise  follow-up if needed   Meds ordered this encounter  Medications  . oseltamivir (TAMIFLU) 75 MG capsule    Sig: Take 1 capsule (75 mg total) by mouth 2 (two) times daily for 5 days.    Dispense:  10 capsule    Refill:  0    Order Specific Question:   Supervising Provider    Answer:   Olin Hauser [[id number]]  . benzonatate (TESSALON) 100 MG capsule    Sig: Take 1 capsule (100 mg total) by mouth 3 (three) times daily as needed for cough.    Dispense:  30 capsule    Refill:  0    Order Specific Question:   Supervising Provider    Answer:   Olin Hauser [[id number]]  . ipratropium (ATROVENT) 0.06 % nasal spray    Sig: Place 2 sprays into both nostrils 4 (four) times daily.    Dispense:  15 mL    Refill:  0    Order Specific Question:   Supervising Provider    Answer:   Olin Hauser [[id number]]  . albuterol (PROVENTIL HFA;VENTOLIN HFA) 108 (90 Base) MCG/ACT inhaler    Sig: Inhale 1-2 puffs into the lungs every 6 (six) hours as needed for wheezing or shortness of breath.    Dispense:  1 Inhaler    Refill:  5    Order Specific Question:   Supervising Provider    Answer:   Olin Hauser [[id number]]     Follow up plan: Return 1-2 weeks if symptoms worsen or fail to improve.  [name], DNP, AGPCNP-BC Adult Gerontology Primary Care Nurse Practitioner [facility] 05/18/2017, 8:20 AM

## 2017-05-18 ENCOUNTER — Encounter: Payer: Self-pay | Admitting: Nurse Practitioner

## 2017-05-25 ENCOUNTER — Encounter: Payer: Self-pay | Admitting: Family Medicine

## 2017-05-25 ENCOUNTER — Ambulatory Visit (INDEPENDENT_AMBULATORY_CARE_PROVIDER_SITE_OTHER): Payer: 59 | Admitting: Family Medicine

## 2017-05-25 VITALS — BP 154/91 | HR 102 | Temp 98.6°F | Resp 16 | Ht 62.0 in | Wt 181.0 lb

## 2017-05-25 DIAGNOSIS — F3341 Major depressive disorder, recurrent, in partial remission: Secondary | ICD-10-CM | POA: Diagnosis not present

## 2017-05-25 DIAGNOSIS — J0100 Acute maxillary sinusitis, unspecified: Secondary | ICD-10-CM | POA: Diagnosis not present

## 2017-05-25 DIAGNOSIS — F419 Anxiety disorder, unspecified: Secondary | ICD-10-CM

## 2017-05-25 DIAGNOSIS — Z8659 Personal history of other mental and behavioral disorders: Secondary | ICD-10-CM

## 2017-05-25 MED ORDER — ESCITALOPRAM OXALATE 10 MG PO TABS: 10.0000 mg | ORAL_TABLET | Freq: Every day | ORAL | 2 refills | Status: DC

## 2017-05-25 MED ORDER — PREDNISONE 10 MG PO TABS: ORAL_TABLET | ORAL | 0 refills | Status: DC

## 2017-05-25 MED ORDER — FLUTICASONE PROPIONATE 50 MCG/ACT NA SUSP: 2.0000 | Freq: Every day | NASAL | 2 refills | Status: DC

## 2017-05-25 MED ORDER — AMOXICILLIN-POT CLAVULANATE 875-125 MG PO TABS: 1.0000 | ORAL_TABLET | Freq: Two times a day (BID) | ORAL | 0 refills | Status: DC

## 2017-05-25 NOTE — Patient Instructions (Addendum)
Thank you for coming to the office today.   1. It sounds like you have a Sinusitis (Bacterial Infection) - this most likely started as an Upper Respiratory Virus that has settled into an infection. Allergies can also cause this. - Start Augmentin 1 pill twice daily (breakfast and dinner, with food and plenty of water) for 10 days, complete entire course, do not stop early even if feeling better  Start Prednisone for sinus pressure and pain - taper down dose every day over next 4 days  - May take OTC allergy med claritin or loratadine daily - Start Flonase 2 sprays in each nostril daily for next 4-6 weeks, then you may stop and use seasonally or as needed  - Improve hydration by drinking plenty of clear fluids (water, gatorade) to reduce secretions and thin congestion  - Congestion draining down throat can cause irritation. May try warm herbal tea with honey, cough drops  - Can take Tylenol or Ibuprofen as needed for fevers   If you develop persistent fever >101F for at least 3 consecutive days, headaches with sinus pain or pressure or persistent earache, please schedule a follow-up evaluation within next few days to week.  As discussed, it sounds like your symptoms are primarily related to anxiety / adjustment disorder. This is a very common problem and be related to several factors, including life stressors.  You have history of Bipolar on chart - if this medicine makes you worse or feel manic or increased energy cannot sleep or hyper or other problems then need to stop and call office  Start treatment with Escitalopram (Lexapro), take 10mg  once daily for next 4 weeks. As discussed most anxiety medications are also used for mood disorders such as depression, because they work on similar chemicals in your brain. It may take up to 3-4 weeks for the medicine to take full effect and for you to notice a difference, sometimes you may notice it working sooner, otherwise we may need to adjust the  dose.  For most patients with anxiety or mood concerns, we generally recommend referral to establish with a therapist or counselor as well. This has been shown to improve the effectiveness of the medications, and in the future we may be able to taper off medications.   Please schedule a Follow-up Appointment to: Return in about 4 weeks (around 06/22/2017) for anxiety / mood PHQ / GAD med adjust.    If you have any other questions or concerns, please feel free to call the office or send a message through [place]. You may also schedule an earlier appointment if necessary.  Additionally, you may be receiving a survey about your experience at our office within a few days to 1 week by e-mail or mail. We value your feedback.  Nobie Putnam, DO [place]

## 2017-05-25 NOTE — Progress Notes (Signed)
Subjective:    Patient ID: Sean Herring, male    DOB: 1970-08-16, 47 y.o.   MRN: [id number]  Sean Herring is a 47 y.o. male presenting on 05/25/2017 for Sinusitis (pt had flu positive in 03/2017 still has some sinusitis, laryngitis, no fever)  HPI  SINUSITIS - Last visit with our office saw [name], AGPCNP-BC on 04/28/17 for influenza A, confirmed on rapid test in office, treated with Tamiflu, Atrovent nasal spray without relief, used Tessalon perls some PRN cough, used Albuterol inhaler some relief. Tried some Tylenol Cold & Sinus/Flu with mild relief. He improved enough to resume work after about 1 week later, but still having lingering symptoms now, see prior notes for background information. - Today patient reports now worsening concern about 4 weeks later with hoarse voice, sinus pressure and pain but has had some improved congestion and sinus drainage - Has not refilled inhaler - Denies fevers chills, nausea vomiting diarrhea, chest pain dyspnea  History of Depression/Mood DIsorder / Anxiety  New topic discussed today at end of visit, he reports concerns with prior history of mood/depression/anxiety and stress from various life stressors, in past he has had flare ups before, previously in 2013 had dx of bipolar, does not recall exact diagnosis but this was per chart review, he was hospitalized in past due to mental health. He was on SSRI reportedly Zoloft and Celexa in past with good results, initial treatment seems to have been from 1990s. Ultimately he has done well and no significant problems until more recently with recent life stressors - Now he complains of problems with life changes, job related stress at work, and 5 children under age 9 living at home, he is concerned about coping with stressors and thinks he needs to resume medicine - He admits some associated insomnia poor sleep - See PHQ GAD score below   Additional updates - Regarding GI - He will follow-up with  [facility] Dr Vicente Males - will re-schedule apt  Depression screen [facility] 2/9 05/25/2017 02/26/2017  Decreased Interest 1 0  Down, Depressed, Hopeless 1 0  PHQ - 2 Score 2 0  Altered sleeping 2 1  Tired, decreased energy 3 1  Change in appetite 3 1  Feeling bad or failure about yourself  0 0  Trouble concentrating 1 0  Moving slowly or fidgety/restless 1 0  Suicidal thoughts 0 0  PHQ-9 Score 12 3  Difficult doing work/chores Somewhat difficult Not difficult at all   GAD 7 : Generalized Anxiety Score 05/25/2017  Nervous, Anxious, on Edge 2  Control/stop worrying 1  Worry too much - different things 1  Trouble relaxing 2  Restless 0  Easily annoyed or irritable 2  Afraid - awful might happen 0  Total GAD 7 Score 8  Anxiety Difficulty Somewhat difficult     Social History   Tobacco Use  . Smoking status: Current Every Day Smoker    Packs/day: 1.00    Years: 30.00    Pack years: 30.00    Types: Cigarettes  . Smokeless tobacco: Never Used  Substance Use Topics  . Alcohol use: No    Comment: Occasional  . Drug use: No    Review of Systems Per HPI unless specifically indicated above     Objective:    BP (!) 154/91   Pulse (!) 102   Temp 98.6 F (37 C) (Oral)   Resp 16   Ht 5\' 2"  (1.575 m)   Wt 181 lb (82.1 kg)  SpO2 99%   BMI 33.11 kg/m   Wt Readings from Last 3 Encounters:  05/25/17 181 lb (82.1 kg)  04/28/17 182 lb (82.6 kg)  03/11/17 181 lb 12.8 oz (82.5 kg)    Physical Exam  Constitutional: He is oriented to person, place, and time. He appears well-developed and well-nourished. No distress.  Mostly well but tired appearing, comfortable, cooperative  HENT:  Head: Normocephalic and atraumatic.  Mouth/Throat: Oropharynx is clear and moist.  Maxillary sinuses mild tender. Nares fairly patent without purulence. Bilateral TMs clear without erythema, effusion or bulging. Oropharynx clear without erythema, exudates, edema or asymmetry.  Eyes: Conjunctivae are normal. Right  eye exhibits no discharge. Left eye exhibits no discharge.  Neck: Normal range of motion. Neck supple.  Cardiovascular: Regular rhythm, normal heart sounds and intact distal pulses.  No murmur heard. tachycardic  Pulmonary/Chest: Effort normal and breath sounds normal. No respiratory distress. He has no wheezes. He has no rales.  Musculoskeletal: Normal range of motion. He exhibits no edema.  Lymphadenopathy:    He has no cervical adenopathy.  Neurological: He is alert and oriented to person, place, and time.  Skin: Skin is warm and dry. No rash noted. He is not diaphoretic. No erythema.  Psychiatric: He has a normal mood and affect. His behavior is normal.  Well groomed, good eye contact, normal speech and thoughts. Mood appears slightly depressed, but appropriate insight into past medical / mental health history  Nursing note and vitals reviewed.    Recent flu test  Results for orders placed or performed in visit on 04/28/17  POCT Influenza A/B  Result Value Ref Range   Influenza A, POC Positive (A) Negative   Influenza B, POC Negative Negative      Assessment & Plan:   Problem List Items Addressed This Visit    Depression    Suspected recent flare mood/depression vs anxiety also possible bipolar (history of this per chart), now with gradual worsening causing more difficulty functioning, previously coped well, now affecting physically with fatigue from poor sleep work stress is primary stressor -GAD7: 8, somewhat difficult / PHQ9: 12 somewhat difficult - Off meds for years, in past on SSRI Celexa, Zoloft  Plan: 1. Discussion on diagnosis likely mixed but possible bipolar with depressive symptoms, no documented recent episodes of mania - advised caution with requested resume SSRI may unmask bipolar symptoms and he may require alternative therapy - if this occurs advised follow-up, stop med, will refer to Psychiatry 2. Start Escitalopram 10mg  daily AM with food, counseling on  potential side effects risk - anticipate 4-6 weeks for notable effect, may need titrate dose to 20 in future 3. Advised recommend therapy / counseling in future -  4. Follow-up 4-6 weeks anxiety, med adjust, GAD7/PHQ9      Relevant Medications   escitalopram (LEXAPRO) 10 MG tablet   History of bipolar disorder   Relevant Medications   escitalopram (LEXAPRO) 10 MG tablet    Other Visit Diagnoses    Acute non-recurrent maxillary sinusitis    -  Primary  Consistent with acute maxillary sinusitis, likely initially viral influenza etiology initially also some allergic rhinitis component with now worsening concern for bacterial infection. Duration >1 month  Plan: 1 Start Augmentin 875-125mg  PO BID x 10 days - Prednisone taper brief course 4 day given - may notify if needed extended, rx for sinus pain and pressure 2. Resume Loratadine (Claritin) 10mg  daily and start Flonase 2 sprays in each nostril daily for next 4-6  weeks, then may stop and use seasonally or as needed 3. Supportive care with nasal saline OTC, hydration, warm compresses and sinus effleurage as demonstrated 4. Return criteria reviewed     Relevant Medications   amoxicillin-clavulanate (AUGMENTIN) 875-125 MG tablet   predniSONE (DELTASONE) 10 MG tablet   fluticasone (FLONASE) 50 MCG/ACT nasal spray   Anxiety       Relevant Medications   escitalopram (LEXAPRO) 10 MG tablet      Meds ordered this encounter  Medications  . amoxicillin-clavulanate (AUGMENTIN) 875-125 MG tablet    Sig: Take 1 tablet by mouth 2 (two) times daily.    Dispense:  20 tablet    Refill:  0  . predniSONE (DELTASONE) 10 MG tablet    Sig: Take 4 tabs with breakfast Day 1, 3 tabs Day 2, 2 tabs Day 3, 1 tab on Day 4    Dispense:  10 tablet    Refill:  0  . fluticasone (FLONASE) 50 MCG/ACT nasal spray    Sig: Place 2 sprays into both nostrils daily. Use for 4-6 weeks then stop and use seasonally or as needed.    Dispense:  16 g    Refill:  2  .  escitalopram (LEXAPRO) 10 MG tablet    Sig: Take 1 tablet (10 mg total) by mouth daily.    Dispense:  30 tablet    Refill:  2      Follow up plan: Return in about 4 weeks (around 06/22/2017) for anxiety / mood PHQ / GAD med adjust.  Nobie Putnam, DO [facility] 05/26/2017, 1:23 AM

## 2017-05-26 ENCOUNTER — Encounter: Payer: Self-pay | Admitting: Family Medicine

## 2017-05-26 NOTE — Assessment & Plan Note (Signed)
Suspected recent flare mood/depression vs anxiety also possible bipolar (history of this per chart), now with gradual worsening causing more difficulty functioning, previously coped well, now affecting physically with fatigue from poor sleep work stress is primary stressor -GAD7: 8, somewhat difficult / PHQ9: 12 somewhat difficult - Off meds for years, in past on SSRI Celexa, Zoloft  Plan: 1. Discussion on diagnosis likely mixed but possible bipolar with depressive symptoms, no documented recent episodes of mania - advised caution with requested resume SSRI may unmask bipolar symptoms and he may require alternative therapy - if this occurs advised follow-up, stop med, will refer to Psychiatry 2. Start Escitalopram 10mg  daily AM with food, counseling on potential side effects risk - anticipate 4-6 weeks for notable effect, may need titrate dose to 20 in future 3. Advised recommend therapy / counseling in future -  4. Follow-up 4-6 weeks anxiety, med adjust, GAD7/PHQ9

## 2017-05-28 ENCOUNTER — Telehealth: Payer: Self-pay | Admitting: Nurse Practitioner

## 2017-05-28 NOTE — Telephone Encounter (Signed)
Dr. Ursula Alert needs pt's last office notes faxed to [phone]

## 2017-06-01 NOTE — Telephone Encounter (Signed)
Notes faxed over to provider.

## 2017-06-30 ENCOUNTER — Encounter: Payer: Self-pay | Admitting: Family Medicine

## 2017-06-30 ENCOUNTER — Ambulatory Visit (INDEPENDENT_AMBULATORY_CARE_PROVIDER_SITE_OTHER): Payer: 59 | Admitting: Family Medicine

## 2017-06-30 ENCOUNTER — Ambulatory Visit
Admission: RE | Admit: 2017-06-30 | Discharge: 2017-06-30 | Disposition: A | Discharge status: Home / Self Care | Payer: 59 | Source: Ambulatory Visit | Attending: Family Medicine | Admitting: Family Medicine

## 2017-06-30 VITALS — BP 160/106 | HR 85 | Temp 98.0°F | Resp 16 | Ht 62.0 in | Wt 174.0 lb

## 2017-06-30 DIAGNOSIS — R05 Cough: Secondary | ICD-10-CM | POA: Diagnosis not present

## 2017-06-30 DIAGNOSIS — J441 Chronic obstructive pulmonary disease with (acute) exacerbation: Secondary | ICD-10-CM

## 2017-06-30 DIAGNOSIS — R0602 Shortness of breath: Secondary | ICD-10-CM

## 2017-06-30 DIAGNOSIS — R5381 Other malaise: Secondary | ICD-10-CM

## 2017-06-30 DIAGNOSIS — R110 Nausea: Secondary | ICD-10-CM

## 2017-06-30 DIAGNOSIS — J9801 Acute bronchospasm: Secondary | ICD-10-CM

## 2017-06-30 DIAGNOSIS — R5383 Other fatigue: Secondary | ICD-10-CM

## 2017-06-30 DIAGNOSIS — R058 Other specified cough: Secondary | ICD-10-CM

## 2017-06-30 LAB — POCT INFLUENZA A/B
INFLUENZA A, POC: NEGATIVE
INFLUENZA B, POC: NEGATIVE

## 2017-06-30 MED ORDER — PREDNISONE 50 MG PO TABS: 50.0000 mg | ORAL_TABLET | Freq: Every day | ORAL | 0 refills | Status: DC

## 2017-06-30 MED ORDER — IPRATROPIUM-ALBUTEROL 0.5-2.5 (3) MG/3ML IN SOLN: 3.0000 mL | Freq: Once | RESPIRATORY_TRACT | Status: DC

## 2017-06-30 MED ORDER — ONDANSETRON 4 MG PO TBDP: 4.0000 mg | ORAL_TABLET | Freq: Three times a day (TID) | ORAL | 2 refills | Status: DC | PRN

## 2017-06-30 MED ORDER — LEVOFLOXACIN 500 MG PO TABS: 500.0000 mg | ORAL_TABLET | Freq: Every day | ORAL | 0 refills | Status: DC

## 2017-06-30 MED ORDER — BENZONATATE 100 MG PO CAPS: 100.0000 mg | ORAL_CAPSULE | Freq: Three times a day (TID) | ORAL | 0 refills | Status: DC | PRN

## 2017-06-30 NOTE — Patient Instructions (Addendum)
Thank you for coming to the office today.  1. It sounds like you had an Upper Respiratory Virus that has settled into a Bronchitis, lower respiratory tract infection. I don't have concerns for pneumonia today, and think that this should gradually improve. Once you are feeling better, the cough may take a few weeks to fully resolve. I do hear wheezing and coarse breath sounds, this may be due to the virus, also could be related to smoking.  Most likely COPD flare up - based on X-ray findings today showed hyperinflated lungs with some changes of emphysema  Start taking Levaquin antibiotic 500mg  daily x 7 days  Start Prednisone 50mg  daily for next 5 days - this will open up lungs allow you to breath better and treat that wheezing or bronchospasm  - Use Albuterol inhaler 2 puffs every 4-6 hours around the clock for next 2-3 days, max up to 5 days then use as needed (ask pharmacist to demonstrate proper inhaler use - make sure they have refills call if need new one)  Start Tessalon Perls take 1 capsule up to 3 times a day as needed for cough  ZOfran refilled - improve intake oral hydration  - Use nasal saline (Simply Saline or Ocean Spray) to flush nasal congestion multiple times a day, may help cough - Drink plenty of fluids to improve congestion  May try OTC Mucinex (or may try Mucinex-DM for cough) up to 7-10 days then stop  If your symptoms seem to worsen instead of improve over next several days, including significant fever / chills, worsening shortness of breath, worsening wheezing, or nausea / vomiting and can't take medicines - return sooner or go to hospital Emergency Department for more immediate treatment.  Please schedule a Follow-up Appointment to: Return in about 1 week (around 07/07/2017), or if symptoms worsen or fail to improve, for COPD.  If you have any other questions or concerns, please feel free to call the office or send a message through [place]. You may also schedule an  earlier appointment if necessary.  Additionally, you may be receiving a survey about your experience at our office within a few days to 1 week by e-mail or mail. We value your feedback.  Nobie Putnam, DO [place]

## 2017-06-30 NOTE — Progress Notes (Signed)
Subjective:    Patient ID: Sean Herring, male    DOB: December 01, 1970, 47 y.o.   MRN: [id number]  Sean Herring is a 47 y.o. male presenting on 06/30/2017 for Fatigue (fatigue, SOB, chest congestion, cough onset yesterday obtw as per pt left arm numbness intermitten and nausea from 3-4 days)  Patient presents for a same day appointment.  HPI   SHORTNESS OF BREATH / Productive Cough / Generalized Fatigue Malaise - Recent complex course, initial sickness on 04/28/17, seen at our office by [name], AGPCNP-BC, diagnosed with Influenza A on positive test confirmed, treated with Tamiflu rx and symptomatic meds including albuterol inhaler. He followed up with me on 05/25/17, for sinusitis duration >1 month with worsening symptoms, treated with Augmentin antibiotics and prednisone course for sinus pressure and cough, and his symptoms seemed to improve by his report. - Now here 4 weeks later with new onset acute illness, describes constellation of symptoms with fatigue, dyspnea short of breath for past few days, has been bed-ridden for 1-2 days due to fatigue, no energy and short of breath, cannot take deep breath, has some productive cough, no new known sick contact. - Not tried OTC meds - Additionally complains of intermittent episodes of Left arm radiating paresthesias tingling without pain, worse if driving or arm is up or provoked. He has had some nausea and vomiting as well on occasion. Poor PO intake as well, reduced appetite and hydration. - He is a currently active smoker, and works as Building control surveyor. He does not have prior diagnosis of COPD or other lung problem before - He is frustrated with recurrent recent illnesses over past few months - Denies any active chest pain or pressure, abdominal pain, diarrhea, headache, sinus pain and pressure ear pain   Depression screen [facility] 2/9 05/25/2017 02/26/2017  Decreased Interest 1 0  Down, Depressed, Hopeless 1 0  PHQ - 2 Score 2 0  Altered sleeping 2 1    Tired, decreased energy 3 1  Change in appetite 3 1  Feeling bad or failure about yourself  0 0  Trouble concentrating 1 0  Moving slowly or fidgety/restless 1 0  Suicidal thoughts 0 0  PHQ-9 Score 12 3  Difficult doing work/chores Somewhat difficult Not difficult at all    Social History   Tobacco Use  . Smoking status: Current Every Day Smoker    Packs/day: 1.00    Years: 30.00    Pack years: 30.00    Types: Cigarettes  . Smokeless tobacco: Never Used  Substance Use Topics  . Alcohol use: No    Comment: Occasional  . Drug use: No    Review of Systems Per HPI unless specifically indicated above     Objective:    BP (!) 160/106   Pulse 85   Temp 98 F (36.7 C) (Oral)   Resp 16   Ht 5\' 2"  (1.575 m)   Wt 174 lb (78.9 kg)   SpO2 97%   BMI 31.83 kg/m   Wt Readings from Last 3 Encounters:  06/30/17 174 lb (78.9 kg)  05/25/17 181 lb (82.1 kg)  04/28/17 182 lb (82.6 kg)    Physical Exam  Constitutional: He is oriented to person, place, and time. He appears well-developed and well-nourished. No distress.  Patient is ill and tired appearing, prefers to lay down, uncomfortable  HENT:  Head: Normocephalic and atraumatic.  Mouth/Throat: Oropharynx is clear and moist.  Mild dry mucus mem  Eyes: Conjunctivae are normal. Right  eye exhibits no discharge. Left eye exhibits no discharge.  Neck: Normal range of motion. Neck supple.  Cardiovascular: Normal rate, regular rhythm, normal heart sounds and intact distal pulses.  No murmur heard. Pulmonary/Chest: He is in respiratory distress. He has wheezes. He has no rales.  Pre-Nebulizer Treatment Mild increased work of breathing with unable to take full deep breaths, some diffuse reduced air movement with tight exp wheezing, no focal crackles but some coarse breath sounds.  Post-Nebulizer (Duoneb x 1) Treatment Significant improved air movement. Work of breathing is improved. Wheezing appears to be mostly resolved.  Occasional cough. Coarse breath sounds improved.   Musculoskeletal: Normal range of motion. He exhibits no edema.  Lymphadenopathy:    He has no cervical adenopathy.  Neurological: He is alert and oriented to person, place, and time.  Skin: Skin is warm and dry. No rash noted. He is not diaphoretic. No erythema.  Psychiatric: His behavior is normal.  Nursing note and vitals reviewed.  I have personally reviewed the radiology report from [facility] Chest X-ray today on 06/30/17.  CLINICAL DATA:  47 year old male with progressive shortness of breath for the past 2-3 days. Productive cough, wheezing, low-grade fever. Influenza in February. Smoker.  EXAM: CHEST - 2 VIEW  COMPARISON:  11/16/2016 and earlier.  FINDINGS: Stable large lung volumes. Mediastinal contours remain normal. Visualized tracheal air column is within normal limits. No pneumothorax, pulmonary edema, pleural effusion or acute pulmonary opacity. Mild diffuse increased interstitial markings appear stable. No acute osseous abnormality identified. Negative visible bowel gas pattern.  IMPRESSION: Suspected chronic pulmonary hyperinflation. No acute cardiopulmonary abnormality.   Electronically Signed   By: Genevie Ann M.D.   On: 06/30/2017 15:10  Results for orders placed or performed in visit on 06/30/17  POCT Influenza A/B  Result Value Ref Range   Influenza A, POC Negative Negative   Influenza B, POC Negative Negative      Assessment & Plan:   Problem List Items Addressed This Visit    Chronic nausea   Relevant Medications   ondansetron (ZOFRAN ODT) 4 MG disintegrating tablet    Other Visit Diagnoses    COPD exacerbation ([place])    -  Primary   Relevant Medications   ipratropium-albuterol (DUONEB) 0.5-2.5 (3) MG/3ML nebulizer solution 3 mL   levofloxacin (LEVAQUIN) 500 MG tablet   benzonatate (TESSALON) 100 MG capsule   predniSONE (DELTASONE) 50 MG tablet   Shortness of breath       Relevant  Medications   ipratropium-albuterol (DUONEB) 0.5-2.5 (3) MG/3ML nebulizer solution 3 mL   Other Relevant Orders   DG Chest 2 View (Completed)   POCT Influenza A/B (Completed)   Productive cough       Relevant Orders   DG Chest 2 View (Completed)   Bronchospasm, acute       Relevant Medications   ipratropium-albuterol (DUONEB) 0.5-2.5 (3) MG/3ML nebulizer solution 3 mL   predniSONE (DELTASONE) 50 MG tablet   Other Relevant Orders   DG Chest 2 View (Completed)   Malaise and fatigue       Relevant Orders   POCT Influenza A/B (Completed)      Consistent with moderate acute exacerbation of COPD with worsening dyspnea, wheezing, productive cough. No prior confirmed COPD dx. Recent recurrent illness with influenza 2 months ago, then sinusitis 1 month ago. Active smoker. - No hypoxia (97% on RA), afebrile, no recent hospitalization  Plan: - Rapid Flu - NEGATIVE - STAT CXR - see results above, reviewed w/  patient during office visit, concern new dx COPD with hyperinflation appears chronic, w/o evidence of acute infection PNA  1. Treat as AECOPD - Start taking Levaquin antibiotic 500mg  daily x 7 days - Start Prednisone 50mg  x 5 day steroid burst - Use existing refill albuterol q 4 hr regularly x 3-5 days - Start Tessalon Perls take 1 capsule up to 3 times a day as needed for cough - Refilled prior Zofran to improve hydration - Trial Mucinex OTC Follow-up within 2-3 days if worse or within 1 week if not improving, otherwise strict return criteria to go to ED  Future recommend return discuss COPD and smoking cessation, may benefit from chronic maintenance inhaler as well in future if frequent flares   Meds ordered this encounter  Medications  . ipratropium-albuterol (DUONEB) 0.5-2.5 (3) MG/3ML nebulizer solution 3 mL  . levofloxacin (LEVAQUIN) 500 MG tablet    Sig: Take 1 tablet (500 mg total) by mouth daily. For 7 days    Dispense:  7 tablet    Refill:  0  . benzonatate (TESSALON)  100 MG capsule    Sig: Take 1 capsule (100 mg total) by mouth 3 (three) times daily as needed for cough.    Dispense:  30 capsule    Refill:  0  . ondansetron (ZOFRAN ODT) 4 MG disintegrating tablet    Sig: Take 1 tablet (4 mg total) by mouth every 8 (eight) hours as needed for nausea or vomiting.    Dispense:  30 tablet    Refill:  2  . predniSONE (DELTASONE) 50 MG tablet    Sig: Take 1 tablet (50 mg total) by mouth daily with breakfast.    Dispense:  5 tablet    Refill:  0    Follow up plan: Return in about 1 week (around 07/07/2017), or if symptoms worsen or fail to improve, for COPD.  Nobie Putnam, [facility] 06/30/2017, 6:00 PM

## 2017-07-12 ENCOUNTER — Telehealth: Payer: Self-pay

## 2017-07-12 DIAGNOSIS — J449 Chronic obstructive pulmonary disease, unspecified: Secondary | ICD-10-CM

## 2017-07-12 MED ORDER — PREDNISONE 10 MG PO TABS: ORAL_TABLET | ORAL | 0 refills | Status: DC

## 2017-07-12 MED ORDER — TIOTROPIUM BROMIDE MONOHYDRATE 18 MCG IN CAPS: 18.0000 ug | ORAL_CAPSULE | Freq: Every day | RESPIRATORY_TRACT | 5 refills | Status: DC

## 2017-07-12 NOTE — Telephone Encounter (Signed)
patient's spouse states patient still has SOB, wheezing similar when he gets panic attack. Patient's SOB and wheezing gets worst when lying down. He run out of albuterol. Cough was improved after taking antibiotics and prednisone.  Called pharmacy and gave verbal for albuterol. She wants Dr. Raliegh Ip recommendation.

## 2017-07-12 NOTE — Telephone Encounter (Signed)
Last seen on 06/30/17, see chart note, he has had a concerning presentation with persistent recurrent symptoms.  Now seems to be improving with last treatment for COPD based on x-ray and in office duoneb and a good response to antibiotic and prednisone.  If he is still having some dyspnea and wheezing, then I will recommend pursue further pulmonary treatment for now - it seems less likely to be other causes such as anxiety / cardiac if already improved on lung treatment.  Will send new rx for extended prednisone taper now today. And also new maintenance EVERYDAY inhaler Spiriva to his pharmacy. We do have rx coupon cards available for pick up at our office for Spiriva if he would like one to potentially reduce cost and copay.  I called and spoke with both patient and his wife. They are up to date on plan. Agree with treatment and referral to Pulmonology ([place], [facility]) they state he was using Albuterol too frequent and may have caused some of his symptoms and he needed new one, alrdy phoned in  Nobie Putnam, [facility] 07/12/2017, 5:58 PM

## 2017-07-20 ENCOUNTER — Encounter: Payer: Self-pay | Admitting: Family Medicine

## 2017-07-20 NOTE — Progress Notes (Signed)
Received fax from [place]) today stating that they could not reach patient by phone and therefore referral was closed.   Patient has been previously referred to Psychiatry for diagnosis of Mood Disorder vs Bipolar.  We will review this with him further if he wishes to follow-up with [place], and my concerns if he does not wish to follow referrals and treatment plan as prescribed.  Nobie Putnam, [facility] 07/20/2017, 5:29 PM

## 2017-09-07 ENCOUNTER — Encounter: Payer: Self-pay | Admitting: Family Medicine

## 2017-09-07 NOTE — Progress Notes (Signed)
Referral placed on 07/12/17 to [facility] Pulmonology.  Copied below is referral message now about 2 months later.  Gerringer, [place] Ginger, Devonne Doughty, DO        We have made several attempts to contact patient with no success. We have sent patient a letter and removed from work queue.    Note this is 2nd referral for patient that he has not followed up with on attempts to schedule.  We will need to review this in future with patient and discuss my concerns for providing appropriate care for him if he cannot follow-up with referrals as agreed upon with treatment plan.  Nobie Putnam, [facility] 09/07/2017, 6:29 PM

## 2017-09-30 ENCOUNTER — Ambulatory Visit: Payer: 59 | Admitting: Gastroenterology

## 2017-10-08 ENCOUNTER — Encounter: Payer: Self-pay | Admitting: Surgery

## 2017-10-08 ENCOUNTER — Encounter: Payer: Self-pay | Admitting: Internal Medicine

## 2017-10-08 ENCOUNTER — Ambulatory Visit: Payer: Self-pay | Admitting: Surgery

## 2017-10-08 DIAGNOSIS — Z720 Tobacco use: Secondary | ICD-10-CM | POA: Insufficient documentation

## 2017-10-08 DIAGNOSIS — K5790 Diverticulosis of intestine, part unspecified, without perforation or abscess without bleeding: Secondary | ICD-10-CM | POA: Insufficient documentation

## 2017-10-08 DIAGNOSIS — K5909 Other constipation: Secondary | ICD-10-CM | POA: Insufficient documentation

## 2017-10-08 DIAGNOSIS — Z8719 Personal history of other diseases of the digestive system: Secondary | ICD-10-CM

## 2017-10-08 DIAGNOSIS — Z91199 Patient's noncompliance with other medical treatment and regimen due to unspecified reason: Secondary | ICD-10-CM | POA: Insufficient documentation

## 2017-10-08 DIAGNOSIS — Z5329 Procedure and treatment not carried out because of patient's decision for other reasons: Secondary | ICD-10-CM | POA: Insufficient documentation

## 2017-10-08 DIAGNOSIS — K432 Incisional hernia without obstruction or gangrene: Secondary | ICD-10-CM | POA: Insufficient documentation

## 2017-10-08 DIAGNOSIS — Z9119 Patient's noncompliance with other medical treatment and regimen: Secondary | ICD-10-CM

## 2017-10-08 DIAGNOSIS — F319 Bipolar disorder, unspecified: Secondary | ICD-10-CM | POA: Insufficient documentation

## 2017-10-08 NOTE — H&P (Signed)
Sean Herring Documented: 10/08/2017 2:11 PM Location: [place] Office Patient #: [id number] DOB: 01-18-1971 Unknown / Language: Sean Herring / Race: Refused to Report/Unreported Male  History of Present Illness Sean Hector MD; 10/08/2017 2:44 PM) The patient is a 46 year old male who presents with abdominal pain. Note for "Abdominal pain": ` ` ` Patient sent for surgical consultation at the request of Dr Sean Herring  Chief Complaint: Abd pain ` ` The patient is a smoking male with history of prior abdominal surgeries. Not all records are available, but it appears he had perforated diverticulitis causing a colovesical fistula. This required urgent Hartmann resection with colostomy 2010 at [facility]. Cystogram w/o leak later. Colostomy takedown in 2011. Developed incisional hernia at his colostomy site based on CAT scan a year later. Fat-containing hernias at his old colostomy site and periumbilically on Jan 7829 CAT scan earlier this year. Struggles with intermittent abdominal pain. Has documentation of some chronic constipation. He smokes. Chronic pain meds. Somewhat of a history of being very difficult to reach / contact and not showing. However has been having worsening abdominal pain. Mentioned to primary care office. New CT scan confirmed hernias. No h/o SBO or any bowel obstructions. Had some moderate stool burden on prior studies but not a significant last time around. Surgical consultation requested.  Patient comes in today with his wife. His main complaint is sharp right groin pain after a bad coughing episode of bronchitis in May. Coughing is less but he still gets intermittent sharp groin pain treating down to his testicle. It was felt like something popped out there. He still feels some lumps around his bellybutton and his left old colostomy site. They can be uncomfortable but not severely tender. He moves his bowels most days. No major bowel changes. He is due to see  gastroenterology for follow-up colonoscopy given the abdominal pain. That has not happened yet.  No personal nor family history of GI/colon cancer, inflammatory bowel disease, irritable bowel syndrome, allergy such as Celiac Sprue, dietary/dairy problems, colitis, ulcers nor gastritis. No recent sick contacts/gastroenteritis. No travel outside the country. No changes in diet. No dysphagia to solids or liquids. No significant heartburn or reflux. No hematochezia, hematemesis, coffee ground emesis. No evidence of prior gastric/peptic ulceration.  (Review of systems as stated in this history (HPI) or in the review of systems. Otherwise all other 12 point ROS are negative) ` ` `   Past Surgical History Sean Herring, CMA; 10/08/2017 2:24 PM) Colon Removal - Partial Resection of Small Bowel Shoulder Surgery Bilateral.  Diagnostic Studies History Sean Herring, CMA; 10/08/2017 2:24 PM) Colonoscopy 5-10 years ago  Allergies Sean Herring, CMA; 10/08/2017 2:24 PM) No Known Drug Allergies [10/08/2017]:  Medication History (Sean Herring, CMA; 10/08/2017 2:24 PM) No Current Medications Medications Reconciled  Social History Sean Herring, CMA; 10/08/2017 2:24 PM) Alcohol use Occasional alcohol use. Caffeine use Coffee, Tea. No drug use Tobacco use Current every day smoker.  Family History Sean Herring, [place]; 10/08/2017 2:24 PM) Cerebrovascular Accident Family Members In General. Heart Disease Family Members In General. Heart disease in male family member before age 93 Hypertension Family Members In General.  Other Problems Sean Herring, [place]; 10/08/2017 2:24 PM) Diverticulosis Kidney Stone     Review of Systems Sean Herring CMA; 10/08/2017 2:24 PM) General Present- Appetite Loss, Fatigue and Weight Loss. Not Present- Chills, Fever, Night Sweats and Weight Gain. Gastrointestinal Present- Abdominal Pain, Bloating, Change in Bowel Habits,  Constipation, Indigestion, Nausea and Vomiting. Not Present- Bloody Stool,  Chronic diarrhea, Difficulty Swallowing, Excessive gas, Gets full quickly at meals, Hemorrhoids and Rectal Pain. Male Genitourinary Present- Urgency. Not Present- Blood in Urine, Change in Urinary Stream, Frequency, Impotence, Nocturia, Painful Urination and Urine Leakage.  Vitals (Sean Herring CMA; 10/08/2017 2:24 PM) 10/08/2017 2:24 PM Weight: 184 lb Height: 63in Body Surface Area: 1.87 m Body Mass Index: 32.59 kg/m  Pulse: 94 (Regular)  BP: 122/78 (Sitting, Left Arm, Standard)      Physical Exam Sean Hector MD; 10/08/2017 2:46 PM)  General Mental Status-Alert. General Appearance-Not in acute distress, Not Sickly. Orientation-Oriented X3. Hydration-Well hydrated. Voice-Normal.  Integumentary Global Assessment Upon inspection and palpation of skin surfaces of the - Axillae: non-tender, no inflammation or ulceration, no drainage. and Distribution of scalp and body hair is normal. General Characteristics Temperature - normal warmth is noted.  Head and Neck Head-normocephalic, atraumatic with no lesions or palpable masses. Face Global Assessment - atraumatic, no absence of expression. Neck Global Assessment - no abnormal movements, no bruit auscultated on the right, no bruit auscultated on the left, no decreased range of motion, non-tender. Trachea-midline. Thyroid Gland Characteristics - non-tender.  Eye Eyeball - Left-Extraocular movements intact, No Nystagmus. Eyeball - Right-Extraocular movements intact, No Nystagmus. Cornea - Left-No Hazy. Cornea - Right-No Hazy. Sclera/Conjunctiva - Left-No scleral icterus, No Discharge. Sclera/Conjunctiva - Right-No scleral icterus, No Discharge. Pupil - Left-Direct reaction to light normal. Pupil - Right-Direct reaction to light normal.  ENMT Ears Pinna - Left - no drainage observed, no generalized  tenderness observed. Right - no drainage observed, no generalized tenderness observed. Nose and Sinuses External Inspection of the Nose - no destructive lesion observed. Inspection of the nares - Left - quiet respiration. Right - quiet respiration. Mouth and Throat Lips - Upper Lip - no fissures observed, no pallor noted. Lower Lip - no fissures observed, no pallor noted. Nasopharynx - no discharge present. Oral Cavity/Oropharynx - Tongue - no dryness observed. Oral Mucosa - no cyanosis observed. Hypopharynx - no evidence of airway distress observed.  Chest and Lung Exam Inspection Movements - Normal and Symmetrical. Accessory muscles - No use of accessory muscles in breathing. Palpation Palpation of the chest reveals - Non-tender. Auscultation Breath sounds - Normal and Clear.  Cardiovascular Auscultation Rhythm - Regular. Murmurs & Other Heart Sounds - Auscultation of the heart reveals - No Murmurs and No Systolic Clicks.  Abdomen Inspection Inspection of the abdomen reveals - No Visible peristalsis and No Abnormal pulsations. Umbilicus - No Bleeding, No Urine drainage. Palpation/Percussion Palpation and Percussion of the abdomen reveal - Soft, Non Tender, No Rebound tenderness, No Rigidity (guarding) and No Cutaneous hyperesthesia. Note: Abdomen soft. Nontender. Not distended. Obvious periumbilical incisional hernia to the right of midline. Ultimately reducible. Larger mass and left lower quadrant over all colostomy incision. No objectively inflamed but incarcerated. Mild diastases recti from his midline incision No guarding.  Male Genitourinary Sexual Maturity Tanner 5 - Adult hair pattern and Adult penile size and shape. Note: Obvious right inguinal hernia with cough impulse. Sensitive. Mild impulse on left side. Testes epididymides and cords otherwise within normal limits. Right testicle mildly sensitive.  Peripheral Vascular Upper Extremity Inspection - Left - No Cyanotic  nailbeds, Not Ischemic. Right - No Cyanotic nailbeds, Not Ischemic.  Neurologic Neurologic evaluation reveals -normal attention span and ability to concentrate, able to name objects and repeat phrases. Appropriate fund of knowledge , normal sensation and normal coordination. Mental Status Affect - not angry, not paranoid. Cranial Nerves-Normal Bilaterally. Gait-Normal.  Neuropsychiatric  Mental status exam performed with findings of-able to articulate well with normal speech/language, rate, volume and coherence, thought content normal with ability to perform basic computations and apply abstract reasoning and no evidence of hallucinations, delusions, obsessions or homicidal/suicidal ideation.  Musculoskeletal Global Assessment Spine, Ribs and Pelvis - no instability, subluxation or laxity. Right Upper Extremity - no instability, subluxation or laxity.  Lymphatic Head & Neck  General Head & Neck Lymphatics: Bilateral - Description - No Localized lymphadenopathy. Axillary  General Axillary Region: Bilateral - Description - No Localized lymphadenopathy. Femoral & Inguinal  Generalized Femoral & Inguinal Lymphatics: Left - Description - No Localized lymphadenopathy. Right - Description - No Localized lymphadenopathy.    Assessment & Plan Sean Hector MD; 10/08/2017 2:49 PM)  INCISIONAL HERNIA, WITHOUT OBSTRUCTION OR GANGRENE (K43.2) Impression: periumbilical incisional hernia ultimately reducible. correlates with CT scan. Sensitive. I think he would benefit from surgical repair.  Current Plans The anatomy & physiology of the abdominal wall was discussed. The pathophysiology of hernias was discussed. Natural history risks without surgery including progeressive enlargement, pain, incarceration, & strangulation was discussed. Contributors to complications such as smoking, obesity, diabetes, prior surgery, etc were discussed.  I feel the risks of no intervention will lead  to serious problems that outweigh the operative risks; therefore, I recommended surgery to reduce and repair the hernia. I explained laparoscopic techniques with possible need for an open approach. I noted the probable use of mesh to patch and/or buttress the hernia repair  Risks such as bleeding, infection, abscess, need for further treatment, heart attack, death, and other risks were discussed. I noted a good likelihood this will help address the problem. Goals of post-operative recovery were discussed as well. Possibility that this will not correct all symptoms was explained. I stressed the importance of low-impact activity, aggressive pain control, avoiding constipation, & not pushing through pain to minimize risk of post-operative chronic pain or injury. Possibility of reherniation especially with smoking, obesity, diabetes, immunosuppression, and other health conditions was discussed. We will work to minimize complications.  An educational handout further explaining the pathology & treatment options was given as well. Questions were answered. The patient expresses understanding & wishes to proceed with surgery.   INCARCERATED INCISIONAL HERNIA (K43.0) Impression: left lower quadrant incisional hernia at old colostomy site larger and incarcerated. Last few CAT scan supplied just omentum/fat and it. Would repair at the same time. Would use mesh to minimize recurrence.  I strongly recommended he quit smoking to minimize progression, wound infection, pain problems, hernia recurrence.   RIGHT INGUINAL HERNIA (K40.90) Impression: obvious right internal hernia. This seems to be the most painful sensitive area, triggered after his bronchitis/coughing episode in May. I wonder if he has a left inguinal hernia as well. Would be aggressive in repairing and reinforcing any potential weak areas. He is interested in proceeding with surgery to fix all areas, especially the right inguinal  hernia.  Current Plans The anatomy & physiology of the abdominal wall and pelvic floor was discussed. The pathophysiology of hernias in the inguinal and pelvic region was discussed. Natural history risks such as progressive enlargement, pain, incarceration, and strangulation was discussed. Contributors to complications such as smoking, obesity, diabetes, prior surgery, etc were discussed.  I feel the risks of no intervention will lead to serious problems that outweigh the operative risks; therefore, I recommended surgery to reduce and repair the hernia. I explained laparoscopic techniques with possible need for an open approach. I noted usual use of mesh  to patch and/or buttress hernia repair  Risks such as bleeding, infection, abscess, need for further treatment, heart attack, death, and other risks were discussed. I noted a good likelihood this will help address the problem. Goals of post-operative recovery were discussed as well. Possibility that this will not correct all symptoms was explained. I stressed the importance of low-impact activity, aggressive pain control, avoiding constipation, & not pushing through pain to minimize risk of post-operative chronic pain or injury. Possibility of reherniation was discussed. We will work to minimize complications.  An educational handout further explaining the pathology & treatment options was given as well. Questions were answered. The patient expresses understanding & wishes to proceed with surgery.   GROIN PAIN, LEFT (R10.32)   PREOP - Muse - ENCOUNTER FOR PREOPERATIVE EXAMINATION FOR GENERAL SURGICAL PROCEDURE (Z01.818)  Current Plans You are being scheduled for surgery- Our schedulers will call you.  You should hear from our office's scheduling department within 5 working days about the location, date, and time of surgery. We try to make accommodations for patient's preferences in scheduling surgery, but sometimes the OR  schedule or the surgeon's schedule prevents [place] from making those accommodations.  If you have not heard from our office [phone]) in 5 working days, call the office and ask for your surgeon's nurse.  If you have other questions about your diagnosis, plan, or surgery, call the office and ask for your surgeon's nurse.  Written instructions provided Pt Education - [facility] Hernia Post-Op HCI (Laritza Vokes): discussed with patient and provided information. Pt Education - [facility] Pain Control (Darline Faith) Pt Education - Pamphlet Given - Laparoscopic Hernia Repair: discussed with patient and provided information. Pt Education - [facility] Mesh education: discussed with patient and provided information.  TOBACCO ABUSE (Z72.0)  Current Plans Pt Education - [facility] STOP SMOKING!  Sean Hector, MD, FACS, MASCRS Gastrointestinal and Minimally Invasive Surgery    [address], [place], [place] [address] [phone] Main / Paging [phone] Fax

## 2017-10-12 ENCOUNTER — Ambulatory Visit: Payer: 59 | Admitting: Gastroenterology

## 2017-10-12 ENCOUNTER — Encounter: Payer: Self-pay | Admitting: *Deleted

## 2017-10-12 ENCOUNTER — Encounter: Payer: Self-pay | Admitting: Gastroenterology

## 2017-10-12 VITALS — BP 127/83 | HR 94 | Ht 62.0 in | Wt 188.4 lb

## 2017-10-12 DIAGNOSIS — K5732 Diverticulitis of large intestine without perforation or abscess without bleeding: Secondary | ICD-10-CM | POA: Diagnosis not present

## 2017-10-12 NOTE — Progress Notes (Signed)
Jonathon Bellows MD, MRCP(U.K) [address]  [place]  [place], [place] [address]  Main: [phone]  Fax: [phone]   Primary Care Physician: Olin Hauser, DO  Primary Gastroenterologist:  Dr. Jonathon Bellows   No chief complaint on file.   HPI: Sean Herring is a 47 y.o. male   Summary of history :  I had seen him back in 12/218 for diverticulitis as a referral. . He had an emergency colostomy for diverticulitis 8 years back and reversed in 2008 . 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 Treated with antibiotics. Did not have any further surgery. 30 pack years of smoking . He was commenced on a course of ciprofloixacin and metronidazole on 02/26/17 by Olin Hauser, DO . Last colonoscopy in 2011 after the stoma was taken out- didn't recall any gross abnormality. No family history of colon cancer or polyps.  At his last visit- felt constipated , noticed a change in bowel movement , RLQ pain . 2 rounds of antibiotics at his last visit.   Interval history   03/11/2017- 10/11/2017   02/2017 : TSH- normal ,Hb 14.3  Ct scan of the abdomen 09/2017- no active diverticulitis.   He says he has 3 abdominal wall hernias which he is going to have surgery soon.   He says Dr Rosario Jacks was concerned about pancreatitis. He has had two CT scan in 03/2017 and 09/2017 neither showed any pancreas issues. Lipase could be elevated for causes other than the pancreas such as gastritis.   Current Outpatient Medications  Medication Sig Dispense Refill  . albuterol (PROVENTIL HFA;VENTOLIN HFA) 108 (90 Base) MCG/ACT inhaler Inhale 1-2 puffs into the lungs every 6 (six) hours as needed for wheezing or shortness of breath. 1 Inhaler 5  . benzonatate (TESSALON) 100 MG capsule Take 1 capsule (100 mg total) by mouth 3 (three) times daily as needed for cough. 30 capsule 0  . escitalopram (LEXAPRO) 10 MG tablet Take 1 tablet (10 mg total)  by mouth daily. 30 tablet 2  . fluticasone (FLONASE) 50 MCG/ACT nasal spray Place 2 sprays into both nostrils daily. Use for 4-6 weeks then stop and use seasonally or as needed. 16 g 2  . ondansetron (ZOFRAN ODT) 4 MG disintegrating tablet Take 1 tablet (4 mg total) by mouth every 8 (eight) hours as needed for nausea or vomiting. 30 tablet 2  . predniSONE (DELTASONE) 10 MG tablet Take 6 tabs with breakfast Day 1, 5 tabs Day 2, 4 tabs Day 3, 3 tabs Day 4, 2 tabs Day 5, 1 tab Day 6. 21 tablet 0  . tamsulosin (FLOMAX) 0.4 MG CAPS capsule Take 1 capsule (0.4 mg total) by mouth daily. 30 capsule 3  . tiotropium (SPIRIVA HANDIHALER) 18 MCG inhalation capsule Place 1 capsule (18 mcg total) into inhaler and inhale daily. 30 capsule 5   No current facility-administered medications for this visit.     Allergies as of 10/12/2017  . (No Known Allergies)    ROS:  General: Negative for anorexia, weight loss, fever, chills, fatigue, weakness. ENT: Negative for hoarseness, difficulty swallowing , nasal congestion. CV: Negative for chest pain, angina, palpitations, dyspnea on exertion, peripheral edema.  Respiratory: Negative for dyspnea at rest, dyspnea on exertion, cough, sputum, wheezing.  GI: See history of present illness. GU:  Negative for dysuria, hematuria, urinary incontinence, urinary frequency, nocturnal urination.  Endo: Negative for unusual weight change.    Physical  Examination:   BP 127/83   Pulse 94   Ht 5\' 2"  (1.575 m)   Wt 188 lb 6.4 oz (85.5 kg)   BMI 34.46 kg/m   General: Well-nourished, well-developed in no acute distress.  Eyes: No icterus. Conjunctivae pink. Mouth: Oropharyngeal mucosa moist and pink , no lesions erythema or exudate. Lungs: Clear to auscultation bilaterally. Non-labored. Heart: Regular rate and rhythm, no murmurs rubs or gallops.  Abdomen: Bowel sounds are normal, nontender, nondistended, no hepatosplenomegaly or masses,3  Scars seen over the abdomen , 2  over the midline and one over the left middle quadrant, no rebound or guarding.   Extremities: No lower extremity edema. No clubbing or deformities. Neuro: Alert and oriented x 3.  Grossly intact. Skin: Warm and dry, no jaundice.   Psych: Alert and cooperative, normal mood and affect.   Imaging Studies: No results found.  Assessment and Plan:   Sean Herring is a 47 y.o. y/o male  Here to follow up  for diverticulitis.He is due for a colonoscopy for his diverticulitis . He is undergoing surgery for his abdominal wall hernias and wants to hold off on his colonoscopy till after surgery . I have advised him to stop smoking . 2 CT abdomens over the past 7 months have not shown any abnormalities of the pancreas.    Dr Jonathon Bellows  MD,MRCP [facility]) Follow up in 4 months

## 2017-10-19 ENCOUNTER — Ambulatory Visit: Payer: 59 | Admitting: Gastroenterology

## 2017-11-23 ENCOUNTER — Encounter (HOSPITAL_COMMUNITY): Payer: Self-pay | Admitting: *Deleted

## 2017-11-23 ENCOUNTER — Other Ambulatory Visit: Payer: Self-pay

## 2017-11-23 NOTE — Progress Notes (Addendum)
Spoke with dr Smith Robert anesthesia, patient off blood pressure medications x 8 years due to weight loss, no ekg needed for 11-25-17 surgery. Cbc only lab needed per dr Smith Robert.

## 2017-11-24 MED ORDER — BUPIVACAINE LIPOSOME 1.3 % IJ SUSP
20.0000 mL | Freq: Once | INTRAMUSCULAR | Status: DC
  Filled 2017-11-24: qty 20

## 2017-11-25 ENCOUNTER — Ambulatory Visit (HOSPITAL_COMMUNITY): Payer: 59 | Admitting: Anesthesiology

## 2017-11-25 ENCOUNTER — Other Ambulatory Visit: Payer: Self-pay

## 2017-11-25 ENCOUNTER — Inpatient Hospital Stay (HOSPITAL_COMMUNITY)
Admission: AD | Admit: 2017-11-25 | Discharge: 2017-11-28 | DRG: 336 | Disposition: A | Discharge status: Home / Self Care | Payer: 59 | Attending: Surgery | Admitting: Surgery

## 2017-11-25 ENCOUNTER — Encounter (HOSPITAL_COMMUNITY): Payer: Self-pay | Admitting: Emergency Medicine

## 2017-11-25 ENCOUNTER — Encounter (HOSPITAL_COMMUNITY)
Admission: AD | Disposition: A | Discharge status: Home / Self Care | Payer: Self-pay | Source: Home / Self Care | Attending: Surgery

## 2017-11-25 DIAGNOSIS — F17210 Nicotine dependence, cigarettes, uncomplicated: Secondary | ICD-10-CM | POA: Diagnosis present

## 2017-11-25 DIAGNOSIS — E785 Hyperlipidemia, unspecified: Secondary | ICD-10-CM | POA: Diagnosis present

## 2017-11-25 DIAGNOSIS — I10 Essential (primary) hypertension: Secondary | ICD-10-CM | POA: Diagnosis present

## 2017-11-25 DIAGNOSIS — Z823 Family history of stroke: Secondary | ICD-10-CM

## 2017-11-25 DIAGNOSIS — K660 Peritoneal adhesions (postprocedural) (postinfection): Secondary | ICD-10-CM | POA: Diagnosis present

## 2017-11-25 DIAGNOSIS — Z8249 Family history of ischemic heart disease and other diseases of the circulatory system: Secondary | ICD-10-CM

## 2017-11-25 DIAGNOSIS — Z720 Tobacco use: Secondary | ICD-10-CM | POA: Diagnosis present

## 2017-11-25 DIAGNOSIS — Z9119 Patient's noncompliance with other medical treatment and regimen: Secondary | ICD-10-CM

## 2017-11-25 DIAGNOSIS — K4020 Bilateral inguinal hernia, without obstruction or gangrene, not specified as recurrent: Secondary | ICD-10-CM | POA: Diagnosis present

## 2017-11-25 DIAGNOSIS — Z6833 Body mass index (BMI) 33.0-33.9, adult: Secondary | ICD-10-CM

## 2017-11-25 DIAGNOSIS — K5909 Other constipation: Secondary | ICD-10-CM | POA: Diagnosis present

## 2017-11-25 DIAGNOSIS — D176 Benign lipomatous neoplasm of spermatic cord: Secondary | ICD-10-CM | POA: Diagnosis present

## 2017-11-25 DIAGNOSIS — K430 Incisional hernia with obstruction, without gangrene: Secondary | ICD-10-CM | POA: Diagnosis present

## 2017-11-25 DIAGNOSIS — Z91199 Patient's noncompliance with other medical treatment and regimen due to unspecified reason: Secondary | ICD-10-CM

## 2017-11-25 DIAGNOSIS — R110 Nausea: Secondary | ICD-10-CM | POA: Diagnosis present

## 2017-11-25 DIAGNOSIS — K219 Gastro-esophageal reflux disease without esophagitis: Secondary | ICD-10-CM | POA: Diagnosis present

## 2017-11-25 DIAGNOSIS — F329 Major depressive disorder, single episode, unspecified: Secondary | ICD-10-CM | POA: Diagnosis present

## 2017-11-25 DIAGNOSIS — E669 Obesity, unspecified: Secondary | ICD-10-CM | POA: Diagnosis present

## 2017-11-25 DIAGNOSIS — K435 Parastomal hernia without obstruction or  gangrene: Secondary | ICD-10-CM | POA: Diagnosis present

## 2017-11-25 HISTORY — DX: Nausea: R11.0

## 2017-11-25 HISTORY — PX: INGUINAL HERNIA REPAIR: SHX194

## 2017-11-25 HISTORY — PX: INSERTION OF MESH: SHX5868

## 2017-11-25 HISTORY — PX: VENTRAL HERNIA REPAIR: SHX424

## 2017-11-25 LAB — CBC
HEMATOCRIT: 39 % (ref 39.0–52.0)
HEMOGLOBIN: 13.5 g/dL (ref 13.0–17.0)
MCH: 31.7 pg (ref 26.0–34.0)
MCHC: 34.6 g/dL (ref 30.0–36.0)
MCV: 91.5 fL (ref 78.0–100.0)
Platelets: 303 10*3/uL (ref 150–400)
RBC: 4.26 MIL/uL (ref 4.22–5.81)
RDW: 13.5 % (ref 11.5–15.5)
WBC: 15.8 10*3/uL — AB (ref 4.0–10.5)

## 2017-11-25 SURGERY — REPAIR, HERNIA, VENTRAL, LAPAROSCOPIC
Anesthesia: General | Site: Inguinal | Laterality: Right

## 2017-11-25 MED ORDER — LACTATED RINGERS IV SOLN: 1000.0000 mL | Freq: Three times a day (TID) | INTRAVENOUS | Status: AC | PRN

## 2017-11-25 MED ORDER — LIDOCAINE 2% (20 MG/ML) 5 ML SYRINGE
INTRAMUSCULAR | Status: DC | PRN
  Administered 2017-11-25: 40 mg via INTRAVENOUS

## 2017-11-25 MED ORDER — SODIUM CHLORIDE 0.9% FLUSH: 3.0000 mL | INTRAVENOUS | Status: DC | PRN

## 2017-11-25 MED ORDER — HYDROMORPHONE HCL 1 MG/ML IJ SOLN
0.5000 mg | INTRAMUSCULAR | Status: DC | PRN
  Administered 2017-11-25 – 2017-11-26 (×3): 1 mg via INTRAVENOUS
  Filled 2017-11-25 (×3): qty 1

## 2017-11-25 MED ORDER — FENTANYL CITRATE (PF) 250 MCG/5ML IJ SOLN
INTRAMUSCULAR | Status: DC | PRN
  Administered 2017-11-25 (×4): 50 ug via INTRAVENOUS

## 2017-11-25 MED ORDER — LIDOCAINE 2% (20 MG/ML) 5 ML SYRINGE
INTRAMUSCULAR | Status: AC
  Filled 2017-11-25: qty 5

## 2017-11-25 MED ORDER — CEFAZOLIN SODIUM-DEXTROSE 2-4 GM/100ML-% IV SOLN
2.0000 g | Freq: Three times a day (TID) | INTRAVENOUS | Status: AC
  Administered 2017-11-25: 2 g via INTRAVENOUS
  Filled 2017-11-25: qty 100

## 2017-11-25 MED ORDER — FENTANYL CITRATE (PF) 100 MCG/2ML IJ SOLN
INTRAMUSCULAR | Status: AC
  Filled 2017-11-25: qty 2

## 2017-11-25 MED ORDER — ONDANSETRON HCL 4 MG/2ML IJ SOLN
INTRAMUSCULAR | Status: DC | PRN
  Administered 2017-11-25: 4 mg via INTRAVENOUS

## 2017-11-25 MED ORDER — GLYCOPYRROLATE PF 0.2 MG/ML IJ SOSY
PREFILLED_SYRINGE | INTRAMUSCULAR | Status: DC | PRN
  Administered 2017-11-25: .1 mg via INTRAVENOUS

## 2017-11-25 MED ORDER — POLYETHYLENE GLYCOL 3350 17 G PO PACK: 17.0000 g | PACK | Freq: Every day | ORAL | Status: DC | PRN

## 2017-11-25 MED ORDER — SIMETHICONE 80 MG PO CHEW: 40.0000 mg | CHEWABLE_TABLET | Freq: Four times a day (QID) | ORAL | Status: DC | PRN

## 2017-11-25 MED ORDER — IBUPROFEN 200 MG PO TABS
600.0000 mg | ORAL_TABLET | Freq: Three times a day (TID) | ORAL | Status: DC | PRN
  Filled 2017-11-25: qty 3

## 2017-11-25 MED ORDER — ESCITALOPRAM OXALATE 10 MG PO TABS
10.0000 mg | ORAL_TABLET | Freq: Every day | ORAL | Status: DC
  Administered 2017-11-25: 10 mg via ORAL
  Filled 2017-11-25 (×2): qty 1

## 2017-11-25 MED ORDER — LIP MEDEX EX OINT
1.0000 "application " | TOPICAL_OINTMENT | Freq: Two times a day (BID) | CUTANEOUS | Status: DC
  Administered 2017-11-25 – 2017-11-27 (×5): 1 via TOPICAL
  Filled 2017-11-25 (×3): qty 7

## 2017-11-25 MED ORDER — BUPIVACAINE-EPINEPHRINE (PF) 0.25% -1:200000 IJ SOLN
INTRAMUSCULAR | Status: DC | PRN
  Administered 2017-11-25: 50 mL

## 2017-11-25 MED ORDER — CEFAZOLIN SODIUM-DEXTROSE 2-4 GM/100ML-% IV SOLN
2.0000 g | INTRAVENOUS | Status: AC
  Administered 2017-11-25: 2 g via INTRAVENOUS
  Filled 2017-11-25: qty 100

## 2017-11-25 MED ORDER — LACTATED RINGERS IV SOLN
INTRAVENOUS | Status: DC
  Administered 2017-11-25 (×3): via INTRAVENOUS

## 2017-11-25 MED ORDER — NAPROXEN 500 MG PO TABS: 500.0000 mg | ORAL_TABLET | Freq: Two times a day (BID) | ORAL | 1 refills | Status: DC | PRN

## 2017-11-25 MED ORDER — METHOCARBAMOL 500 MG PO TABS: 750.0000 mg | ORAL_TABLET | Freq: Four times a day (QID) | ORAL | Status: DC | PRN

## 2017-11-25 MED ORDER — ALBUTEROL SULFATE HFA 108 (90 BASE) MCG/ACT IN AERS
INHALATION_SPRAY | RESPIRATORY_TRACT | Status: DC | PRN
  Administered 2017-11-25 (×2): 4 via RESPIRATORY_TRACT

## 2017-11-25 MED ORDER — PROCHLORPERAZINE MALEATE 10 MG PO TABS: 10.0000 mg | ORAL_TABLET | Freq: Four times a day (QID) | ORAL | Status: DC | PRN

## 2017-11-25 MED ORDER — EPHEDRINE 5 MG/ML INJ
INTRAVENOUS | Status: AC
  Filled 2017-11-25: qty 20

## 2017-11-25 MED ORDER — ONDANSETRON HCL 4 MG/2ML IJ SOLN: 4.0000 mg | Freq: Four times a day (QID) | INTRAMUSCULAR | Status: DC | PRN

## 2017-11-25 MED ORDER — MIDAZOLAM HCL 2 MG/2ML IJ SOLN
1.0000 mg | INTRAMUSCULAR | Status: DC
  Administered 2017-11-25: 2 mg via INTRAVENOUS

## 2017-11-25 MED ORDER — CELECOXIB 200 MG PO CAPS
200.0000 mg | ORAL_CAPSULE | ORAL | Status: AC
  Administered 2017-11-25: 200 mg via ORAL
  Filled 2017-11-25: qty 1

## 2017-11-25 MED ORDER — ACETAMINOPHEN 500 MG PO TABS
1000.0000 mg | ORAL_TABLET | ORAL | Status: AC
  Administered 2017-11-25: 1000 mg via ORAL
  Filled 2017-11-25: qty 2

## 2017-11-25 MED ORDER — METOPROLOL TARTRATE 5 MG/5ML IV SOLN: 5.0000 mg | Freq: Four times a day (QID) | INTRAVENOUS | Status: DC | PRN

## 2017-11-25 MED ORDER — MIDAZOLAM HCL 2 MG/2ML IJ SOLN
INTRAMUSCULAR | Status: AC
  Administered 2017-11-25: 2 mg via INTRAVENOUS
  Filled 2017-11-25: qty 2

## 2017-11-25 MED ORDER — MAGIC MOUTHWASH
15.0000 mL | Freq: Four times a day (QID) | ORAL | Status: DC | PRN
  Filled 2017-11-25: qty 15

## 2017-11-25 MED ORDER — GABAPENTIN 300 MG PO CAPS
300.0000 mg | ORAL_CAPSULE | Freq: Two times a day (BID) | ORAL | Status: DC
  Administered 2017-11-25 – 2017-11-26 (×3): 300 mg via ORAL
  Filled 2017-11-25 (×3): qty 1

## 2017-11-25 MED ORDER — DEXAMETHASONE SODIUM PHOSPHATE 10 MG/ML IJ SOLN
INTRAMUSCULAR | Status: DC | PRN
  Administered 2017-11-25: 10 mg via INTRAVENOUS

## 2017-11-25 MED ORDER — GUAIFENESIN-DM 100-10 MG/5ML PO SYRP: 10.0000 mL | ORAL_SOLUTION | ORAL | Status: DC | PRN

## 2017-11-25 MED ORDER — 0.9 % SODIUM CHLORIDE (POUR BTL) OPTIME
TOPICAL | Status: DC | PRN
  Administered 2017-11-25: 1000 mL

## 2017-11-25 MED ORDER — ONDANSETRON 4 MG PO TBDP: 4.0000 mg | ORAL_TABLET | Freq: Four times a day (QID) | ORAL | Status: DC | PRN

## 2017-11-25 MED ORDER — SODIUM CHLORIDE 0.9 % IV SOLN
250.0000 mL | INTRAVENOUS | Status: DC | PRN
  Administered 2017-11-25: 250 mL via INTRAVENOUS

## 2017-11-25 MED ORDER — ROCURONIUM BROMIDE 10 MG/ML (PF) SYRINGE
PREFILLED_SYRINGE | INTRAVENOUS | Status: DC | PRN
  Administered 2017-11-25 (×2): 20 mg via INTRAVENOUS
  Administered 2017-11-25: 30 mg via INTRAVENOUS
  Administered 2017-11-25: 70 mg via INTRAVENOUS

## 2017-11-25 MED ORDER — LIDOCAINE 2% (20 MG/ML) 5 ML SYRINGE
INTRAMUSCULAR | Status: DC | PRN
  Administered 2017-11-25: 1.5 mg/kg/h via INTRAVENOUS

## 2017-11-25 MED ORDER — FENTANYL CITRATE (PF) 100 MCG/2ML IJ SOLN
25.0000 ug | INTRAMUSCULAR | Status: DC | PRN
  Administered 2017-11-25 (×3): 50 ug via INTRAVENOUS

## 2017-11-25 MED ORDER — POLYETHYLENE GLYCOL 3350 17 G PO PACK: 17.0000 g | PACK | Freq: Every day | ORAL | Status: DC

## 2017-11-25 MED ORDER — PROCHLORPERAZINE EDISYLATE 10 MG/2ML IJ SOLN: 5.0000 mg | Freq: Four times a day (QID) | INTRAMUSCULAR | Status: DC | PRN

## 2017-11-25 MED ORDER — ALUM & MAG HYDROXIDE-SIMETH 200-200-20 MG/5ML PO SUSP: 30.0000 mL | Freq: Four times a day (QID) | ORAL | Status: DC | PRN

## 2017-11-25 MED ORDER — POLYETHYLENE GLYCOL 3350 17 G PO PACK: 17.0000 g | PACK | Freq: Two times a day (BID) | ORAL | Status: DC | PRN

## 2017-11-25 MED ORDER — HYDRALAZINE HCL 20 MG/ML IJ SOLN: 5.0000 mg | INTRAMUSCULAR | Status: DC | PRN

## 2017-11-25 MED ORDER — BISACODYL 10 MG RE SUPP: 10.0000 mg | Freq: Every day | RECTAL | Status: DC | PRN

## 2017-11-25 MED ORDER — PHENOL 1.4 % MT LIQD
1.0000 | OROMUCOSAL | Status: DC | PRN
  Filled 2017-11-25: qty 177

## 2017-11-25 MED ORDER — GABAPENTIN 300 MG PO CAPS
300.0000 mg | ORAL_CAPSULE | ORAL | Status: AC
  Administered 2017-11-25: 300 mg via ORAL
  Filled 2017-11-25: qty 1

## 2017-11-25 MED ORDER — OXYCODONE HCL 5 MG PO TABS: 5.0000 mg | ORAL_TABLET | Freq: Four times a day (QID) | ORAL | 0 refills | Status: DC | PRN

## 2017-11-25 MED ORDER — OXYCODONE HCL 5 MG PO TABS
5.0000 mg | ORAL_TABLET | ORAL | Status: DC | PRN
  Administered 2017-11-25: 5 mg via ORAL
  Filled 2017-11-25: qty 1

## 2017-11-25 MED ORDER — ALBUTEROL SULFATE (2.5 MG/3ML) 0.083% IN NEBU: 3.0000 mL | INHALATION_SOLUTION | Freq: Four times a day (QID) | RESPIRATORY_TRACT | Status: DC | PRN

## 2017-11-25 MED ORDER — MENTHOL 3 MG MT LOZG: 1.0000 | LOZENGE | OROMUCOSAL | Status: DC | PRN

## 2017-11-25 MED ORDER — PROMETHAZINE HCL 25 MG/ML IJ SOLN: 6.2500 mg | INTRAMUSCULAR | Status: DC | PRN

## 2017-11-25 MED ORDER — KETAMINE HCL 10 MG/ML IJ SOLN
INTRAMUSCULAR | Status: DC | PRN
  Administered 2017-11-25 (×2): 30 mg via INTRAVENOUS

## 2017-11-25 MED ORDER — ACETAMINOPHEN 500 MG PO TABS: 1000.0000 mg | ORAL_TABLET | Freq: Three times a day (TID) | ORAL | Status: DC | PRN

## 2017-11-25 MED ORDER — ESMOLOL HCL 100 MG/10ML IV SOLN
INTRAVENOUS | Status: AC
  Filled 2017-11-25: qty 10

## 2017-11-25 MED ORDER — MIDAZOLAM HCL 2 MG/2ML IJ SOLN
INTRAMUSCULAR | Status: AC
  Filled 2017-11-25: qty 2

## 2017-11-25 MED ORDER — PROPOFOL 10 MG/ML IV BOLUS
INTRAVENOUS | Status: DC | PRN
  Administered 2017-11-25: 180 mg via INTRAVENOUS

## 2017-11-25 MED ORDER — STERILE WATER FOR IRRIGATION IR SOLN
Status: DC | PRN
  Administered 2017-11-25: 1000 mL

## 2017-11-25 MED ORDER — ENOXAPARIN SODIUM 40 MG/0.4ML ~~LOC~~ SOLN
40.0000 mg | SUBCUTANEOUS | Status: DC
  Administered 2017-11-26 – 2017-11-28 (×3): 40 mg via SUBCUTANEOUS
  Filled 2017-11-25 (×3): qty 0.4

## 2017-11-25 MED ORDER — PROPOFOL 10 MG/ML IV BOLUS
INTRAVENOUS | Status: AC
  Filled 2017-11-25: qty 20

## 2017-11-25 MED ORDER — BUPIVACAINE LIPOSOME 1.3 % IJ SUSP
INTRAMUSCULAR | Status: DC | PRN
  Administered 2017-11-25: 20 mL

## 2017-11-25 MED ORDER — PHENYLEPHRINE 40 MCG/ML (10ML) SYRINGE FOR IV PUSH (FOR BLOOD PRESSURE SUPPORT)
PREFILLED_SYRINGE | INTRAVENOUS | Status: AC
  Filled 2017-11-25: qty 10

## 2017-11-25 MED ORDER — FENTANYL CITRATE (PF) 100 MCG/2ML IJ SOLN
INTRAMUSCULAR | Status: AC
  Administered 2017-11-25: 100 ug via INTRAVENOUS
  Filled 2017-11-25: qty 2

## 2017-11-25 MED ORDER — BUPIVACAINE HCL (PF) 0.25 % IJ SOLN
INTRAMUSCULAR | Status: AC
  Filled 2017-11-25: qty 60

## 2017-11-25 MED ORDER — PHENYLEPHRINE 40 MCG/ML (10ML) SYRINGE FOR IV PUSH (FOR BLOOD PRESSURE SUPPORT)
PREFILLED_SYRINGE | INTRAVENOUS | Status: DC | PRN
  Administered 2017-11-25 (×2): 80 ug via INTRAVENOUS

## 2017-11-25 MED ORDER — TIOTROPIUM BROMIDE MONOHYDRATE 18 MCG IN CAPS
18.0000 ug | ORAL_CAPSULE | Freq: Every day | RESPIRATORY_TRACT | Status: DC
  Filled 2017-11-25: qty 5

## 2017-11-25 MED ORDER — SUGAMMADEX SODIUM 200 MG/2ML IV SOLN
INTRAVENOUS | Status: DC | PRN
  Administered 2017-11-25: 300 mg via INTRAVENOUS

## 2017-11-25 MED ORDER — ROCURONIUM BROMIDE 10 MG/ML (PF) SYRINGE
PREFILLED_SYRINGE | INTRAVENOUS | Status: AC
  Filled 2017-11-25: qty 10

## 2017-11-25 MED ORDER — MIDAZOLAM HCL 5 MG/5ML IJ SOLN
INTRAMUSCULAR | Status: DC | PRN
  Administered 2017-11-25: 2 mg via INTRAVENOUS

## 2017-11-25 MED ORDER — DIPHENHYDRAMINE HCL 50 MG/ML IJ SOLN: 12.5000 mg | Freq: Four times a day (QID) | INTRAMUSCULAR | Status: DC | PRN

## 2017-11-25 MED ORDER — HYDROCORTISONE 1 % EX CREA
1.0000 "application " | TOPICAL_CREAM | Freq: Three times a day (TID) | CUTANEOUS | Status: DC | PRN
  Filled 2017-11-25: qty 28

## 2017-11-25 MED ORDER — NICOTINE 14 MG/24HR TD PT24
14.0000 mg | MEDICATED_PATCH | Freq: Every day | TRANSDERMAL | Status: DC
  Administered 2017-11-25 – 2017-11-28 (×4): 14 mg via TRANSDERMAL
  Filled 2017-11-25 (×4): qty 1

## 2017-11-25 MED ORDER — GLYCOPYRROLATE PF 0.2 MG/ML IJ SOSY
PREFILLED_SYRINGE | INTRAMUSCULAR | Status: AC
  Filled 2017-11-25: qty 1

## 2017-11-25 MED ORDER — FLUTICASONE PROPIONATE 50 MCG/ACT NA SUSP: 2.0000 | Freq: Every day | NASAL | Status: DC

## 2017-11-25 MED ORDER — FENTANYL CITRATE (PF) 100 MCG/2ML IJ SOLN
INTRAMUSCULAR | Status: AC
  Filled 2017-11-25: qty 4

## 2017-11-25 MED ORDER — SODIUM CHLORIDE 0.9 % IV SOLN
INTRAVENOUS | Status: DC
  Administered 2017-11-25 (×2): via INTRAVENOUS

## 2017-11-25 MED ORDER — ONDANSETRON HCL 4 MG/2ML IJ SOLN
INTRAMUSCULAR | Status: AC
  Filled 2017-11-25: qty 2

## 2017-11-25 MED ORDER — FENTANYL CITRATE (PF) 100 MCG/2ML IJ SOLN
50.0000 ug | INTRAMUSCULAR | Status: DC
  Administered 2017-11-25: 100 ug via INTRAVENOUS

## 2017-11-25 MED ORDER — ACETAMINOPHEN 500 MG PO TABS
1000.0000 mg | ORAL_TABLET | Freq: Three times a day (TID) | ORAL | Status: DC
  Administered 2017-11-25 – 2017-11-26 (×2): 1000 mg via ORAL
  Filled 2017-11-25 (×2): qty 2

## 2017-11-25 MED ORDER — HYDROCORTISONE 2.5 % RE CREA
1.0000 "application " | TOPICAL_CREAM | Freq: Four times a day (QID) | RECTAL | Status: DC | PRN
  Filled 2017-11-25: qty 28.35

## 2017-11-25 MED ORDER — DIPHENHYDRAMINE HCL 12.5 MG/5ML PO ELIX: 12.5000 mg | ORAL_SOLUTION | Freq: Four times a day (QID) | ORAL | Status: DC | PRN

## 2017-11-25 MED ORDER — SUGAMMADEX SODIUM 500 MG/5ML IV SOLN
INTRAVENOUS | Status: AC
  Filled 2017-11-25: qty 5

## 2017-11-25 MED ORDER — DEXAMETHASONE SODIUM PHOSPHATE 10 MG/ML IJ SOLN
INTRAMUSCULAR | Status: AC
  Filled 2017-11-25: qty 1

## 2017-11-25 MED ORDER — EPHEDRINE SULFATE-NACL 50-0.9 MG/10ML-% IV SOSY
PREFILLED_SYRINGE | INTRAVENOUS | Status: DC | PRN
  Administered 2017-11-25: 5 mg via INTRAVENOUS

## 2017-11-25 MED ORDER — BUPIVACAINE HCL (PF) 0.25 % IJ SOLN
INTRAMUSCULAR | Status: DC | PRN
  Administered 2017-11-25: 30 mL

## 2017-11-25 MED ORDER — SODIUM CHLORIDE 0.9% FLUSH
3.0000 mL | Freq: Two times a day (BID) | INTRAVENOUS | Status: DC
  Administered 2017-11-26 – 2017-11-27 (×3): 3 mL via INTRAVENOUS

## 2017-11-25 MED ORDER — TAMSULOSIN HCL 0.4 MG PO CAPS
0.4000 mg | ORAL_CAPSULE | Freq: Every day | ORAL | Status: DC
  Administered 2017-11-25 – 2017-11-28 (×3): 0.4 mg via ORAL
  Filled 2017-11-25 (×4): qty 1

## 2017-11-25 SURGICAL SUPPLY — 48 items
APPLIER CLIP 5 13 M/L LIGAMAX5 (MISCELLANEOUS)
BINDER ABDOMINAL 12 ML 46-62 (SOFTGOODS) ×4 IMPLANT
CABLE HIGH FREQUENCY MONO STRZ (ELECTRODE) ×4 IMPLANT
CHLORAPREP W/TINT 26ML (MISCELLANEOUS) ×4 IMPLANT
CLIP APPLIE 5 13 M/L LIGAMAX5 (MISCELLANEOUS) IMPLANT
COVER SURGICAL LIGHT HANDLE (MISCELLANEOUS) ×4 IMPLANT
DECANTER SPIKE VIAL GLASS SM (MISCELLANEOUS) ×4 IMPLANT
DEVICE SECURE STRAP 25 ABSORB (INSTRUMENTS) ×8 IMPLANT
DEVICE TROCAR PUNCTURE CLOSURE (ENDOMECHANICALS) ×4 IMPLANT
DRAPE WARM FLUID 44X44 (DRAPE) ×4 IMPLANT
DRSG TEGADERM 2-3/8X2-3/4 SM (GAUZE/BANDAGES/DRESSINGS) ×4 IMPLANT
DRSG TEGADERM 4X4.75 (GAUZE/BANDAGES/DRESSINGS) ×8 IMPLANT
ELECT REM PT RETURN 15FT ADLT (MISCELLANEOUS) ×4 IMPLANT
GAUZE SPONGE 2X2 8PLY STRL LF (GAUZE/BANDAGES/DRESSINGS) ×3 IMPLANT
GLOVE ECLIPSE 8.0 STRL XLNG CF (GLOVE) ×4 IMPLANT
GLOVE INDICATOR 8.0 STRL GRN (GLOVE) ×4 IMPLANT
GOWN STRL REUS W/TWL XL LVL3 (GOWN DISPOSABLE) ×8 IMPLANT
IRRIG SUCT STRYKERFLOW 2 WTIP (MISCELLANEOUS) ×4
IRRIGATION SUCT STRKRFLW 2 WTP (MISCELLANEOUS) ×3 IMPLANT
KIT BASIN OR (CUSTOM PROCEDURE TRAY) ×4 IMPLANT
MARKER SKIN DUAL TIP RULER LAB (MISCELLANEOUS) ×4 IMPLANT
MESH HERNIA 6X6 BARD (Mesh General) ×9 IMPLANT
MESH HERNIA BARD 6X6 (Mesh General) ×3 IMPLANT
MESH VENTRALIGHT ST 10X13IN (Mesh General) ×4 IMPLANT
NEEDLE INSUFFLATION 14GA 120MM (NEEDLE) IMPLANT
NEEDLE SPNL 22GX3.5 QUINCKE BK (NEEDLE) IMPLANT
PAD POSITIONING PINK XL (MISCELLANEOUS) ×4 IMPLANT
SCISSORS LAP 5X35 DISP (ENDOMECHANICALS) ×4 IMPLANT
SHEARS HARMONIC ACE PLUS 36CM (ENDOMECHANICALS) IMPLANT
SLEEVE ADV FIXATION 5X100MM (TROCAR) ×12 IMPLANT
SPONGE GAUZE 2X2 STER 10/PKG (GAUZE/BANDAGES/DRESSINGS) ×1
SPONGE LAP 18X18 RF (DISPOSABLE) ×4 IMPLANT
STRIP CLOSURE SKIN 1/2X4 (GAUZE/BANDAGES/DRESSINGS) ×4 IMPLANT
SUT MNCRL AB 4-0 PS2 18 (SUTURE) ×4 IMPLANT
SUT PDS AB 1 CT1 27 (SUTURE) ×12 IMPLANT
SUT PROLENE 1 CT 1 30 (SUTURE) ×24 IMPLANT
SUT VIC AB 2-0 SH 27 (SUTURE)
SUT VIC AB 2-0 SH 27X BRD (SUTURE) IMPLANT
SUT VICRYL 0 UR6 27IN ABS (SUTURE) ×4 IMPLANT
TACKER 5MM HERNIA 3.5CML NAB (ENDOMECHANICALS) IMPLANT
TOWEL OR 17X26 10 PK STRL BLUE (TOWEL DISPOSABLE) ×4 IMPLANT
TOWEL OR NON WOVEN STRL DISP B (DISPOSABLE) ×4 IMPLANT
TRAY LAPAROSCOPIC (CUSTOM PROCEDURE TRAY) ×4 IMPLANT
TROCAR ADV FIXATION 11X100MM (TROCAR) IMPLANT
TROCAR ADV FIXATION 5X100MM (TROCAR) ×4 IMPLANT
TROCAR BLADELESS OPT 5 100 (ENDOMECHANICALS) ×4 IMPLANT
TROCAR XCEL BLUNT TIP 100MML (ENDOMECHANICALS) ×4 IMPLANT
TUBING INSUF HEATED (TUBING) ×4 IMPLANT

## 2017-11-25 NOTE — Anesthesia Preprocedure Evaluation (Signed)
Anesthesia Evaluation  Patient identified by MRN, date of birth, ID band Patient awake    Reviewed: Allergy & Precautions, NPO status , Patient's Chart, lab work & pertinent test results  Airway Mallampati: II  TM Distance: >3 FB Neck ROM: Full    Dental no notable dental hx.    Pulmonary Current Smoker,    Pulmonary exam normal breath sounds clear to auscultation       Cardiovascular Normal cardiovascular exam Rhythm:Regular Rate:Normal     Neuro/Psych negative neurological ROS  negative psych ROS   GI/Hepatic negative GI ROS, Neg liver ROS, GERD  ,  Endo/Other  negative endocrine ROS  Renal/GU negative Renal ROS  negative genitourinary   Musculoskeletal negative musculoskeletal ROS (+)   Abdominal   Peds negative pediatric ROS (+)  Hematology negative hematology ROS (+)   Anesthesia Other Findings   Reproductive/Obstetrics negative OB ROS                             Anesthesia Physical Anesthesia Plan  ASA: II  Anesthesia Plan: General   Post-op Pain Management:    Induction: Intravenous  PONV Risk Score and Plan: Ondansetron, Dexamethasone and Treatment may vary due to age or medical condition  Airway Management Planned: Oral ETT  Additional Equipment:   Intra-op Plan:   Post-operative Plan: Extubation in OR  Informed Consent: I have reviewed the patients History and Physical, chart, labs and discussed the procedure including the risks, benefits and alternatives for the proposed anesthesia with the patient or authorized representative who has indicated his/her understanding and acceptance.   Dental advisory given  Plan Discussed with: CRNA and Surgeon  Anesthesia Plan Comments:         Anesthesia Quick Evaluation

## 2017-11-25 NOTE — Op Note (Signed)
11/25/2017  2:30 PM  PATIENT:  Sean Herring  47 y.o. male  Patient Care Team: Normajean Baxter, MD as PCP - General (Family Medicine) Jonathon Bellows, MD as Consulting Physician (Gastroenterology) Henry Russel, MD as Consulting Physician (Orthopedic Surgery) Michael Boston, MD as Consulting Physician (General Surgery)  PRE-OPERATIVE DIAGNOSIS:  Hernia in abdomen and  groins  POST-OPERATIVE DIAGNOSIS:    Bilateral inguinal hernias Incarcerated incisional hernias  PROCEDURE:    LAPAROSCOPIC Dw[place] OF MESH   SURGEON:  Adin Hector, MD  ASSISTANT: OR Staff  ANESTHESIA:     Regional ilioinguinal and genitofemoral and spermatic cord nerve blocks  General  EBL:  Total I/O In: 1000 [I.V.:1000] Out: 75 [Blood:75].  See anesthesia record  Delay start of Pharmacological VTE agent (>24hrs) due to surgical blood loss or risk of bleeding:  no  DRAINS: NONE  SPECIMEN:  NONE  DISPOSITION OF SPECIMEN:  N/A  COUNTS:  YES  PLAN OF CARE: Discharge to home after PACU  PATIENT DISPOSITION:  PACU - hemodynamically stable.  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  Also right groin pain with definite right inguinal hernia.  Left groin pain developing suspicious for left inguinal hernia as well.  I recommended diagnostic laparoscopy with repair of hernias found  The anatomy & physiology of the abdominal wall and pelvic floor was discussed.  The pathophysiology of hernias in the inguinal and pelvic region was discussed.  Natural history risks such as progressive enlargement, pain, incarceration & strangulation was discussed.   Contributors to complications such as smoking, obesity, diabetes, prior surgery, etc were  discussed.    I feel the risks of no intervention will lead to serious problems that outweigh the operative risks; therefore, I recommended surgery to reduce and repair the hernia.  I explained laparoscopic techniques with possible need for an open approach.  I noted usual use of mesh to patch and/or buttress hernia repair  Risks such as bleeding, infection, abscess, need for further treatment, heart attack, death, and other risks were discussed.  I noted a good likelihood this will help address the problem.   Goals of post-operative recovery were discussed as well.  Possibility that this will not correct all symptoms was explained.  I stressed the importance of low-impact activity, aggressive pain control, avoiding constipation, & not pushing through pain to minimize risk of post-operative chronic pain or injury. Possibility of reherniation was discussed.  We will work to minimize complications.     An educational handout further explaining the pathology & treatment options was given as well.  Questions were answered.  The patient expresses understanding & wishes to proceed with surgery.  OR FINDINGS:   Incisional hernia is incarcerated omentum.  Swiss cheese type pattern from epigastric to infraumbilical region.  6 x 5 cm periumbilical region with thinning out of skin.  3 x 3 cm left lower quadrant colostomy incisional hernia incarcerated with moderate volume of omentum.  About 18 x 12 cm region total.  Type of repair: Laparoscopic underlay repair   Placement of mesh: Intraperitoneal underlay repair supraumbilically.  Preperitoneal infraumbilically  Name of mesh: Bard Ventralight dual sided (polypropylene / Seprafilm)  Size of mesh: 33x27cm  Orientation: Oblique from right upper quadrant to left lower quadrant  Mesh overlap:  5-7cm  Bilateral indirect inguinal  hernias.  Right greater than left.  Some direct space laxity as well.  No femoral nor obturator hernias.  DESCRIPTION:  Informed  consent was confirmed. The patient underwent general anaesthesia without difficulty. The patient was positioned appropriately. VTE prevention in place. The patient's abdomen was clipped, prepped, & draped in a sterile fashion. Surgical timeout confirmed our plan.  The patient was positioned in reverse Trendelenburg. Abdominal entry was gained using optical entry technique in the left upper abdomen. Entry was clean. I induced carbon dioxide insufflation. Camera inspection revealed no injury. Extra ports were carefully placed under direct laparoscopic visualization.   I could see omentum adherent to the anterior abdominal wall consistent with incarcerated incisional hernias.  I used laparoscopic lysis adhesions to reduce the omentum down to expose numerous Swiss cheese hernias.  Some less than a centimeter.  Largest region 6 x 5 cm periumbilically.  Came to left lower quadrant at the old colostomy site.  This had a moderate volume of greater omentum stuck in a fascial defect 12.  That was about 3 x 3 cm.  As noted above, this was about a 18 x 12 cm region at the least.  Confirmed hernias and right greater than left inguinal regions.  I decided to proceed with a transabdominal preperitoneal approach.  I scored the peritoneum from the right flank to the left flank.  Entered in the preperitoneal space.  He was rather clean on the right side but it was quite fibrotic and stuck along the infraumbilical periumbilical midline.  Also towards the colostomy.  He had moderate diastases recti as well.  Eventually I was able to reduce the midline incisional adhesions and get to more clean preperitoneal plane.  I focused attention on the LEFT pelvis.   I used blunt & focused sharp dissection to free the peritoneum off the flank and down to the pubic rim.  I freed the anteriolateral bladder wall off the anteriolateral pelvic wall, sparing midline attachments.   I located a swath of peritoneum going into a hernia fascial defect  at the  internal ring consistent with  an indirect inguinal hernia..  I gradually freed the peritoneal hernia sac off safely and reduced it into the preperitoneal space.  I freed the peritoneum off the spermatic vessels & vas deferens.  I freed peritoneum off the retroperitoneum along the psoas muscle.  Spermatic cord lipoma was dissected away & removed.  I checked & assured hemostasis.     I turned attention on the opposite  RIGHT pelvis.  I did dissection in a similar, mirror-image fashion. The patient had an indirect inguinal hernia..   Also some evidence of direct space hernia as well.  This was the more obvious and symptomatic side.  Spermatic cord lipoma was dissected away & removed.    I checked & assured hemostasis.     I chose 15x15 cm sheets of medium weight polypropylene mesh (Bard), one for each side.  I cut a single sigmoid-shaped slit ~6cm from a corner of each mesh.  I placed the meshes into the preperitoneal space & laid them as overlapping diamonds such that at the inferior points, a 6x6 cm corner flap rested in the true anterolateral pelvis, covering the obturator & femoral foramina.   I allowed the bladder to return to the pubis, this helping tuck the corners of the mesh in the anteriolateral pelvis.  The medial corners overlapped each other across midline cephalad to the pubic rim.   Given the numerous hernias of  moderate size, I placed a third 15x15cm mesh in the center as a vertical diamond.  The lateral wings of the mesh overlap across the direct spaces and internal rings where the dominant hernias were.  This provided good coverage and reinforcement of the hernia repairs.  I used an absorbable secure strap tacker to help tack the mesh suprapubically and along the rectus muscles to hold it in place.  I focused on the incisional hernia repairs.  I mapped out the region using a needle passer.   To ensure that I would have at least 5 cm radial coverage outside of the hernia defect, I chose  a 33x27cm dual sided mesh.  I placed #1 Prolene stitches around quarters of the circumference about every 5 cm = 13 total.  I rolled the mesh & placed into the peritoneal cavity through the left lower quadrant colostomy defect.  I unrolled the mesh and positioned it appropriately.  I secured the mesh to cover up the hernia defect using a laparoscopic suture passer to pass the tails of the Prolene through the abdominal wall & tagged them with clamps for good transfascial suturing.  I started out in four corners to make sure I had the mesh centered under the hernia defect appropriately, and then proceeded to work in quadrants.  The lower third ended up going down and overlapping with the Bard inguinal hernia meshes.  I used #1 Prolene sutures in and out the abdominal wall.  Also in and out the mesh and and the infraumbilical peritoneum to help tacked the peritoneum back up.  This helped secure the inferior half of the mesh to the anterior abdominal wall.  We evacuated CO2 & desufflated the abdomen.  I tied the fascial stitches down. I closed the colostomy hernia fascial defect that I placed the mesh through using #1 PDS interrupted transverse stitches primarily.  Unfortunately the umbilicus became thinned out and somewhat ischemic.  I did not feel was salvageable.  Therefore had to excise it.  This exposed the largest hernia defect periumbilically.  I closed that transversely using #1 PDS in a running fashion from each corner.  Thus the 2 largest hernia defects of his numerous Swiss cheese hernia defects were primarily closed.  I reinsufflated the abdomen. The mesh provided at least circumferential coverage around the entire region of hernia defects.  I secured the mesh centrally with an additional trans fascial stitch in & out the mesh using #1 PDS under laparoscopic visualization at the periumbilical hernia fascial closures and the colostomy fascial closures..   I tacked the edges & central part of the mesh to  the peritoneum/posterior rectus fascia with SecureStrap absorbable tacks.   I did reinspection. Hemostasis was good. Mesh laid well. I completed a broad field block of local anesthesia at fascial stitch sites & fascial closure areas.    Capnoperitoneum was evacuated. Ports were removed.  I excised the redundant skin periumbilically with a curvilinear supraumbilical incision.  He Artie had some diverting from his prior midline incision that created a new pseudo-umbilicus.  Excised some redundant excess fat and thinned out tissues to have much healthier subcutaneous tissue and skin for to come together.  No more thinned out macerated areas.  I closed that with some interrupted deep dermal and subcuticular sutures.  The skin was closed with Monocryl at the port sites and Steri-Strips on the fascial stitch puncture sites.  Patient is being extubated to go to the recovery room.  I discussed operative findings, updated  the patient's status, discussed probable steps to recovery, and gave postoperative recommendations to the patient's spouse.  Recommendations were made.  Questions were answered.  She expressed understanding & appreciation.  Adin Hector, M.D., F.A.C.S. Gastrointestinal and Minimally Invasive Surgery [facility] [address], [place], [place] [address] [phone] Main / Paging  11/25/2017 2:30 PM

## 2017-11-25 NOTE — Anesthesia Procedure Notes (Signed)
Anesthesia Procedure Image    

## 2017-11-25 NOTE — Anesthesia Procedure Notes (Signed)
Procedure Name: Intubation Date/Time: 11/25/2017 10:09 AM Performed by: Mitzie Na, CRNA Pre-anesthesia Checklist: Patient identified, Emergency Drugs available, Suction available, Patient being monitored and Timeout performed Patient Re-evaluated:Patient Re-evaluated prior to induction Oxygen Delivery Method: Circle system utilized Preoxygenation: Pre-oxygenation with 100% oxygen Induction Type: IV induction Ventilation: Mask ventilation without difficulty and Oral airway inserted - appropriate to patient size Laryngoscope Size: Mac and 3 Grade View: Grade I Tube type: Oral Tube size: 7.5 mm Number of attempts: 1 Airway Equipment and Method: Stylet Placement Confirmation: ETT inserted through vocal cords under direct vision,  positive ETCO2 and breath sounds checked- equal and bilateral Secured at: 23 cm Tube secured with: Tape Dental Injury: Teeth and Oropharynx as per pre-operative assessment

## 2017-11-25 NOTE — Anesthesia Postprocedure Evaluation (Signed)
Anesthesia Post Note  Patient: Sean Herring  Procedure(s) Performed: LAPAROSCOPIC incisional HERNIA WITH LYSIS OF ADHESIONS ERAS PATHWAY (N/A Abdomen) LAPAROSCOPIC bilateral inguinal hernia, (Right Inguinal) INSERTION OF MESH for bilateral inguinal hernias and incisional hernia X3 (Bilateral Abdomen)     Patient location during evaluation: PACU Anesthesia Type: General Level of consciousness: awake and alert Pain management: pain level controlled Vital Signs Assessment: post-procedure vital signs reviewed and stable Respiratory status: spontaneous breathing, nonlabored ventilation, respiratory function stable and patient connected to nasal cannula oxygen Cardiovascular status: blood pressure returned to baseline and stable Postop Assessment: no apparent nausea or vomiting Anesthetic complications: no    Last Vitals:  Vitals:   11/25/17 1500 11/25/17 1515  BP: (!) 140/100 (!) 140/98  Pulse: 83 87  Resp: 10 15  Temp:    SpO2: 98% 97%    Last Pain:  Vitals:   11/25/17 1515  TempSrc:   PainSc: 5                  Izreal Kock S

## 2017-11-25 NOTE — Transfer of Care (Signed)
Immediate Anesthesia Transfer of Care Note  Patient: Sean Herring  Procedure(s) Performed: LAPAROSCOPIC incisional HERNIA WITH LYSIS OF ADHESIONS ERAS PATHWAY (N/A Abdomen) LAPAROSCOPIC bilateral inguinal hernia, (Right Inguinal) INSERTION OF MESH for bilateral inguinal hernias and incisional hernia X3 (Bilateral Abdomen)  Patient Location: PACU  Anesthesia Type:General  Level of Consciousness: awake, alert , oriented and patient cooperative  Airway & Oxygen Therapy: Patient Spontanous Breathing and Patient connected to face mask oxygen  Post-op Assessment: Report given to RN, Post -op Vital signs reviewed and stable and Patient moving all extremities  Post vital signs: Reviewed and stable  Last Vitals:  Vitals Value Taken Time  BP 151/107 11/25/2017  2:30 PM  Temp    Pulse 85 11/25/2017  2:30 PM  Resp 20 11/25/2017  2:30 PM  SpO2 100 % 11/25/2017  2:30 PM  Vitals shown include unvalidated device data.  Last Pain:  Vitals:   11/25/17 0759  TempSrc:   PainSc: 4       Patients Stated Pain Goal: 4 (07/86/75 4492)  Complications: No apparent anesthesia complications

## 2017-11-25 NOTE — Anesthesia Procedure Notes (Signed)
Anesthesia Regional Block: TAP block   Pre-Anesthetic Checklist: ,, timeout performed, Correct Patient, Correct Site, Correct Laterality, Correct Procedure, Correct Position, site marked, Risks and benefits discussed,  Surgical consent,  Pre-op evaluation,  At surgeon's request and post-op pain management  Laterality: Left and Right  Prep: chloraprep       Needles:  Injection technique: Single-shot  Needle Type: Echogenic Needle     Needle Length: 9cm      Additional Needles:   Procedures:,,,, ultrasound used (permanent image in chart),,,,  Narrative:  Start time: 11/25/2017 9:30 AM End time: 11/25/2017 9:45 AM Injection made incrementally with aspirations every 5 mL.  Performed by: Personally  Anesthesiologist: Myrtie Soman, MD  Additional Notes: Patient tolerated the procedure well without complications  Bilateral TAP block

## 2017-11-25 NOTE — Progress Notes (Signed)
Assisted Dr. Kalman Shan with right, left, transabdominal plane block. Side rails up, monitors on throughout procedure. See vital signs in flow sheet. Tolerated Procedure well.

## 2017-11-25 NOTE — H&P (Signed)
Billie Ruddy  DOB: 02-Jun-1970  Patient Care Team: Normajean Baxter, MD as PCP - General (Family Medicine) Jonathon Bellows, MD as Consulting Physician (Gastroenterology) Henry Russel, MD as Consulting Physician (Orthopedic Surgery) Michael Boston, MD as Consulting Physician (General Surgery)   Patient sent for surgical consultation at the request of Dr Rosario Jacks  Chief Complaint: Incisional and inguinal hernias ` ` The patient is a smoking male with history of prior abdominal surgeries. Not all records are available, but it appears he had perforated diverticulitis causing a colovesical fistula. This required urgent Hartmann resection with colostomy 2010 at [facility]. Cystogram w/o leak later. Colostomy takedown in 2011. Developed incisional hernia at his colostomy site based on CAT scan a year later. Fat-containing hernias at his old colostomy site and periumbilically on Jan 7062 CAT scan earlier this year. Struggles with intermittent abdominal pain. Has documentation of some chronic constipation. He smokes. Chronic pain meds. Somewhat of a history of being very difficult to reach / contact and not showing. However has been having worsening abdominal pain. Mentioned to primary care office. New CT scan confirmed hernias. No h/o SBO or any bowel obstructions. Had some moderate stool burden on prior studies but not a significant last time around. Surgical consultation requested.  Patient comes in today with his wife. His main complaint is sharp right groin pain after a bad coughing episode of bronchitis in May. Coughing is less but he still gets intermittent sharp groin pain treating down to his testicle. It was felt like something popped out there. He still feels some lumps around his bellybutton and his left old colostomy site. They can be uncomfortable but not severely tender. He moves his bowels most days. No major bowel changes. He is due to see gastroenterology for follow-up  colonoscopy given the abdominal pain. That has not happened yet.  No personal nor family history of GI/colon cancer, inflammatory bowel disease, irritable bowel syndrome, allergy such as Celiac Sprue, dietary/dairy problems, colitis, ulcers nor gastritis. No recent sick contacts/gastroenteritis. No travel outside the country. No changes in diet. No dysphagia to solids or liquids. No significant heartburn or reflux. No hematochezia, hematemesis, coffee ground emesis. No evidence of prior gastric/peptic ulceration.  (Review of systems as stated in this history (HPI) or in the review of systems. Otherwise all other 12 point ROS are negative) ` ` `   Past Surgical History Illene Regulus, CMA; 10/08/2017 2:24 PM) Colon Removal - Partial Resection of Small Bowel Shoulder Surgery Bilateral.  Diagnostic Studies History Lars Mage Spillers, CMA; 10/08/2017 2:24 PM) Colonoscopy 5-10 years ago  Allergies Lars Mage Spillers, CMA; 10/08/2017 2:24 PM) No Known Drug Allergies [10/08/2017]:  Medication History (Alisha Spillers, CMA; 10/08/2017 2:24 PM) No Current Medications Medications Reconciled  Social History Illene Regulus, CMA; 10/08/2017 2:24 PM) Alcohol use Occasional alcohol use. Caffeine use Coffee, Tea. No drug use Tobacco use Current every day smoker.  Family History Illene Regulus, [place]; 10/08/2017 2:24 PM) Cerebrovascular Accident Family Members In General. Heart Disease Family Members In General. Heart disease in male family member before age 17 Hypertension Family Members In General.  Other Problems Illene Regulus, [place]; 10/08/2017 2:24 PM) Diverticulosis Kidney Stone     Review of Systems Lars Mage Spillers CMA; 10/08/2017 2:24 PM) General Present- Appetite Loss, Fatigue and Weight Loss. Not Present- Chills, Fever, Night Sweats and Weight Gain. Gastrointestinal Present- Abdominal Pain, Bloating, Change in Bowel Habits, Constipation,  Indigestion, Nausea and Vomiting. Not Present- Bloody Stool, Chronic diarrhea, Difficulty Swallowing, Excessive gas, Gets full quickly at meals,  Hemorrhoids and Rectal Pain. Male Genitourinary Present- Urgency. Not Present- Blood in Urine, Change in Urinary Stream, Frequency, Impotence, Nocturia, Painful Urination and Urine Leakage.  Vitals (Alisha Spillers CMA; 10/08/2017 2:24 PM) 10/08/2017 2:24 PM Weight: 184 lb Height: 63in Body Surface Area: 1.87 m Body Mass Index: 32.59 kg/m  Pulse: 94 (Regular)  BP: 122/78 (Sitting, Left Arm, Standard)   BP (!) 131/96   Pulse 86   Temp 98 F (36.7 C) (Oral)   Resp 18   Ht 5\' 2"  (1.575 m)   Wt 82.7 kg   SpO2 99%   BMI 33.36 kg/m     Physical Exam Adin Hector MD; 10/08/2017 2:46 PM)  General Mental Status-Alert. General Appearance-Not in acute distress, Not Sickly. Orientation-Oriented X3. Hydration-Well hydrated. Voice-Normal.  Integumentary Global Assessment Upon inspection and palpation of skin surfaces of the - Axillae: non-tender, no inflammation or ulceration, no drainage. and Distribution of scalp and body hair is normal. General Characteristics Temperature - normal warmth is noted.  Head and Neck Head-normocephalic, atraumatic with no lesions or palpable masses. Face Global Assessment - atraumatic, no absence of expression. Neck Global Assessment - no abnormal movements, no bruit auscultated on the right, no bruit auscultated on the left, no decreased range of motion, non-tender. Trachea-midline. Thyroid Gland Characteristics - non-tender.  Eye Eyeball - Left-Extraocular movements intact, No Nystagmus. Eyeball - Right-Extraocular movements intact, No Nystagmus. Cornea - Left-No Hazy. Cornea - Right-No Hazy. Sclera/Conjunctiva - Left-No scleral icterus, No Discharge. Sclera/Conjunctiva - Right-No scleral icterus, No Discharge. Pupil - Left-Direct reaction to light  normal. Pupil - Right-Direct reaction to light normal.  ENMT Ears Pinna - Left - no drainage observed, no generalized tenderness observed. Right - no drainage observed, no generalized tenderness observed. Nose and Sinuses External Inspection of the Nose - no destructive lesion observed. Inspection of the nares - Left - quiet respiration. Right - quiet respiration. Mouth and Throat Lips - Upper Lip - no fissures observed, no pallor noted. Lower Lip - no fissures observed, no pallor noted. Nasopharynx - no discharge present. Oral Cavity/Oropharynx - Tongue - no dryness observed. Oral Mucosa - no cyanosis observed. Hypopharynx - no evidence of airway distress observed.  Chest and Lung Exam Inspection Movements - Normal and Symmetrical. Accessory muscles - No use of accessory muscles in breathing. Palpation Palpation of the chest reveals - Non-tender. Auscultation Breath sounds - Normal and Clear.  Cardiovascular Auscultation Rhythm - Regular. Murmurs & Other Heart Sounds - Auscultation of the heart reveals - No Murmurs and No Systolic Clicks.  Abdomen Inspection Inspection of the abdomen reveals - No Visible peristalsis and No Abnormal pulsations. Umbilicus - No Bleeding, No Urine drainage. Palpation/Percussion Palpation and Percussion of the abdomen reveal - Soft, Non Tender, No Rebound tenderness, No Rigidity (guarding) and No Cutaneous hyperesthesia. Note: Abdomen soft. Nontender. Not distended. Obvious periumbilical incisional hernia to the right of midline. Ultimately reducible. Larger mass and left lower quadrant over all colostomy incision. No objectively inflamed but incarcerated. Mild diastases recti from his midline incision No guarding.  Male Genitourinary Sexual Maturity Tanner 5 - Adult hair pattern and Adult penile size and shape. Note: Obvious right inguinal hernia with cough impulse. Sensitive. Mild impulse on left side. Testes epididymides and cords  otherwise within normal limits. Right testicle mildly sensitive.  Peripheral Vascular Upper Extremity Inspection - Left - No Cyanotic nailbeds, Not Ischemic. Right - No Cyanotic nailbeds, Not Ischemic.  Neurologic Neurologic evaluation reveals -normal attention span and ability to concentrate, able  to name objects and repeat phrases. Appropriate fund of knowledge , normal sensation and normal coordination. Mental Status Affect - not angry, not paranoid. Cranial Nerves-Normal Bilaterally. Gait-Normal.  Neuropsychiatric Mental status exam performed with findings of-able to articulate well with normal speech/language, rate, volume and coherence, thought content normal with ability to perform basic computations and apply abstract reasoning and no evidence of hallucinations, delusions, obsessions or homicidal/suicidal ideation.  Musculoskeletal Global Assessment Spine, Ribs and Pelvis - no instability, subluxation or laxity. Right Upper Extremity - no instability, subluxation or laxity.  Lymphatic Head & Neck  General Head & Neck Lymphatics: Bilateral - Description - No Localized lymphadenopathy. Axillary  General Axillary Region: Bilateral - Description - No Localized lymphadenopathy. Femoral & Inguinal  Generalized Femoral & Inguinal Lymphatics: Left - Description - No Localized lymphadenopathy. Right - Description - No Localized lymphadenopathy.    Assessment & Plan   INCISIONAL HERNIA, WITHOUT OBSTRUCTION OR GANGRENE (K43.2) Impression: periumbilical incisional hernia ultimately reducible. correlates with CT scan. Sensitive. I think he would benefit from surgical repair.  The anatomy & physiology of the abdominal wall was discussed. The pathophysiology of hernias was discussed. Natural history risks without surgery including progeressive enlargement, pain, incarceration, & strangulation was discussed. Contributors to complications such as smoking,  obesity, diabetes, prior surgery, etc were discussed.  I feel the risks of no intervention will lead to serious problems that outweigh the operative risks; therefore, I recommended surgery to reduce and repair the hernia. I explained laparoscopic techniques with possible need for an open approach. I noted the probable use of mesh to patch and/or buttress the hernia repair  Risks such as bleeding, infection, abscess, need for further treatment, heart attack, death, and other risks were discussed. I noted a good likelihood this will help address the problem. Goals of post-operative recovery were discussed as well. Possibility that this will not correct all symptoms was explained. I stressed the importance of low-impact activity, aggressive pain control, avoiding constipation, & not pushing through pain to minimize risk of post-operative chronic pain or injury. Possibility of reherniation especially with smoking, obesity, diabetes, immunosuppression, and other health conditions was discussed. We will work to minimize complications.  An educational handout further explaining the pathology & treatment options was given as well. Questions were answered. The patient expresses understanding & wishes to proceed with surgery.   INCARCERATED INCISIONAL HERNIA (K43.0) Impression: left lower quadrant incisional hernia at old colostomy site larger and incarcerated. Last few CAT scan supplied just omentum/fat and it. Would repair at the same time. Would use mesh to minimize recurrence.  I strongly recommended he quit smoking to minimize progression, wound infection, pain problems, hernia recurrence.   RIGHT INGUINAL HERNIA (K40.90) Impression: obvious right internal hernia. This seems to be the most painful sensitive area, triggered after his bronchitis/coughing episode in May. I wonder if he has a left inguinal hernia as well. Would be aggressive in repairing and reinforcing any potential weak  areas. He is interested in proceeding with surgery to fix all areas, especially the right inguinal hernia.  Current Plans The anatomy & physiology of the abdominal wall and pelvic floor was discussed. The pathophysiology of hernias in the inguinal and pelvic region was discussed. Natural history risks such as progressive enlargement, pain, incarceration, and strangulation was discussed. Contributors to complications such as smoking, obesity, diabetes, prior surgery, etc were discussed.  I feel the risks of no intervention will lead to serious problems that outweigh the operative risks; therefore, I recommended surgery to  reduce and repair the hernia. I explained laparoscopic techniques with possible need for an open approach. I noted usual use of mesh to patch and/or buttress hernia repair  Risks such as bleeding, infection, abscess, need for further treatment, heart attack, death, and other risks were discussed. I noted a good likelihood this will help address the problem. Goals of post-operative recovery were discussed as well. Possibility that this will not correct all symptoms was explained. I stressed the importance of low-impact activity, aggressive pain control, avoiding constipation, & not pushing through pain to minimize risk of post-operative chronic pain or injury. Possibility of reherniation was discussed. We will work to minimize complications.  An educational handout further explaining the pathology & treatment options was given as well. Questions were answered. The patient expresses understanding & wishes to proceed with surgery.   GROIN PAIN, LEFT (R10.32) Suspect left groin pain is actually a hernia based on CAT scan and possibly on exam.  Low threshold patch that region if there is evidence of hernia there as well.  He already has 3 other sites.  TOBACCO ABUSE (Z72.0)  Current Plans Pt Education - [facility] STOP SMOKING!  STOP SMOKING! We talked to the patient  about the dangers of smoking.  We stressed that tobacco use dramatically increases the risk of peri-operative complications such as infection, tissue necrosis leaving to problems with incision/wound and organ healing, hernia, chronic pain, heart attack, stroke, DVT, pulmonary embolism, and death.  We noted there are programs in our community to help stop smoking.  Information was available.   Adin Hector, MD, FACS, MASCRS Gastrointestinal and Minimally Invasive Surgery    [address], [place], [place] [address] [phone] Main / Paging [phone] Fax

## 2017-11-25 NOTE — Interval H&P Note (Signed)
History and Physical Interval Note:  11/25/2017 9:34 AM  Sean Herring  has presented today for surgery, with the diagnosis of Hernia in abdomen and  groins  The various methods of treatment have been discussed with the patient and family. After consideration of risks, benefits and other options for treatment, the patient has consented to  Procedure(s) with comments: [place] (N/A) - BILATERAL TAP BLOCK LAPAROSCOPIC RIGHT POSSIBLE LEFT INGUINAL HERNIA REPAIR (Right) INSERTION OF MESH (Right) as a surgical intervention .  The patient's history has been reviewed, patient examined, no change in status, stable for surgery.  I have reviewed the patient's chart and labs.  Questions were answered to the patient's satisfaction.    I have re-reviewed the the patient's records, history, medications, and allergies.  I have re-examined the patient.  I again discussed intraoperative plans and goals of post-operative recovery.  The patient agrees to proceed.  Sean Herring  Mar 28, 1970 [id number]  Patient Care Team: Normajean Baxter, MD as PCP - General (Family Medicine) Jonathon Bellows, MD as Consulting Physician (Gastroenterology) Henry Russel, MD as Consulting Physician (Orthopedic Surgery) Michael Boston, MD as Consulting Physician (General Surgery)  Patient Active Problem List   Diagnosis Date Noted  . Bipolar 1 disorder ([place])     Priority: Medium  . Depression 08/24/2013    Priority: Medium  . Incisional hernia, without obstruction or gangrene 10/08/2017  . Tobacco abuse 10/08/2017  . Diverticulosis 10/08/2017  . Patient difficult to contact 10/08/2017  . Constipation, chronic 10/08/2017  . History of no-show for appointments 10/08/2017  . Left rotator cuff tear arthropathy 03/12/2015  . Left shoulder pain 06/26/2014  . Chronic abdominal pain 08/24/2013  . Fatigue 08/24/2013  . Chronic nausea 09/13/2012  . Family history of  heart disease in male family member before age 63 11/03/2011  . Hyperlipidemia 07/17/2011  . Hypertension 06/16/2011  . History of colonic diverticulitis 03/23/2009    Past Medical History:  Diagnosis Date  . Anxiety   . Biceps tendon rupture 04/2011   s/p repair  . Bipolar 1 disorder ([facility])    no current meds x 8 years  . 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 Depression   . Diverticulitis    s/p colon surgery w/ostomy  . GERD (gastroesophageal reflux disease)   . HTN (hypertension)    off all bp meds x 8 years due to weight loss  . Hyperlipidemia   . Nausea last 6 months   due to hernias  . Syncope and collapse 2011 or 2-12 due to bp meds    Past Surgical History:  Procedure Laterality Date  . APPENDECTOMY  2010  . BICEPS TENDON REPAIR Right 04/2011  . COLECTOMY WITH COLOSTOMY CREATION/HARTMANN PROCEDURE  01/2009   perf diverticulitis.  [facility] Dr Elta Guadeloupe Bird/Dr Geryl Rankins  . COLOSTOMY TAKEDOWN  07/2009   [facility]  . ROTATOR CUFF REPAIR Right 2011   Right shoulder 2011, and Left shoulder 2015  . ROTATOR CUFF REPAIR Left 2015    Social History   Socioeconomic History  . Marital status: Married    Spouse name: Not on file  . Number of children: 3  . Years of education: Not on file  . Highest education level: Not on file  Occupational History  . Occupation: Personal assistant: [employer]  Social Needs  . Financial resource strain: Not on file  . Food insecurity:  Worry: Not on file    Inability: Not on file  . Transportation needs:    Medical: Not on file    Non-medical: Not on file  Tobacco Use  . Smoking status: Current Every Day Smoker    Packs/day: 1.00    Years: 30.00    Pack years: 30.00    Types: Cigarettes  . Smokeless tobacco: Never Used  Substance and Sexual Activity  . Alcohol use: No    Comment: rare  . Drug use: Not Currently    Types: Marijuana    Comment: marijuana in past   . Sexual activity: Not on file  Lifestyle  . Physical activity:    Days per week: Not on file    Minutes per session: Not on file  . Stress: Not on file  Relationships  . Social connections:    Talks on phone: Not on file    Gets together: Not on file    Attends religious service: Not on file    Active member of club or organization: Not on file    Attends meetings of clubs or organizations: Not on file    Relationship status: Not on file  . Intimate partner violence:    Fear of current or ex partner: Not on file    Emotionally abused: Not on file    Physically abused: Not on file    Forced sexual activity: Not on file  Other Topics Concern  . Not on file  Social History Narrative   Lives in [place] with wife, 3 children 21YO,18YO,15YO. Works - Building control surveyor.    Family History  Problem Relation Age of Onset  . Depression Mother   . Heart disease Maternal Uncle   . Heart attack Maternal Grandmother   . Heart attack Maternal Grandfather     Medications Prior to Admission  Medication Sig Dispense Refill Last Dose  . acetaminophen (TYLENOL) 500 MG tablet Take 1,000 mg by mouth every 8 (eight) hours as needed for moderate pain or headache.   11/24/2017 at 1900  . ibuprofen (ADVIL,MOTRIN) 200 MG tablet Take 600 mg by mouth every 8 (eight) hours as needed for headache or moderate pain.   11/23/2017  . albuterol (PROVENTIL HFA;VENTOLIN HFA) 108 (90 Base) MCG/ACT inhaler Inhale 1-2 puffs into the lungs every 6 (six) hours as needed for wheezing or shortness of breath. (Patient not taking: Reported on 10/12/2017) 1 Inhaler 5 Not Taking at Unknown time  . escitalopram (LEXAPRO) 10 MG tablet Take 1 tablet (10 mg total) by mouth daily. (Patient not taking: Reported on 10/12/2017) 30 tablet 2 Not Taking at Unknown time  . fluticasone (FLONASE) 50 MCG/ACT nasal spray Place 2 sprays into both nostrils daily. Use for 4-6 weeks then stop and use seasonally or as needed. (Patient not taking: Reported on  10/12/2017) 16 g 2 Completed Course at Unknown time  . ondansetron (ZOFRAN ODT) 4 MG disintegrating tablet Take 1 tablet (4 mg total) by mouth every 8 (eight) hours as needed for nausea or vomiting. (Patient not taking: Reported on 10/12/2017) 30 tablet 2 Completed Course at Unknown time  . tamsulosin (FLOMAX) 0.4 MG CAPS capsule Take 1 capsule (0.4 mg total) by mouth daily. (Patient not taking: Reported on 10/12/2017) 30 capsule 3 Not Taking at Unknown time  . tiotropium (SPIRIVA HANDIHALER) 18 MCG inhalation capsule Place 1 capsule (18 mcg total) into inhaler and inhale daily. (Patient not taking: Reported on 10/12/2017) 30 capsule 5 Not Taking at Unknown time    Current  Facility-Administered Medications  Medication Dose Route Frequency Provider Last Rate Last Dose  . fentaNYL (SUBLIMAZE) 100 MCG/2ML injection           . midazolam (VERSED) 2 MG/2ML injection           . bupivacaine liposome (EXPAREL) 1.3 % injection 266 mg  20 mL Infiltration Once Michael Boston, MD      . ceFAZolin (ANCEF) IVPB 2g/100 mL premix  2 g Intravenous On Call to OR Michael Boston, MD      . fentaNYL (SUBLIMAZE) injection 50-100 mcg  50-100 mcg Intravenous Pollyann Glen, MD      . lactated ringers infusion   Intravenous Continuous Myrtie Soman, MD 50 mL/hr at 11/25/17 0802    . midazolam (VERSED) injection 1-2 mg  1-2 mg Intravenous Pollyann Glen, MD         No Known Allergies  BP (!) 131/96   Pulse 86   Temp 98 F (36.7 C) (Oral)   Resp 18   Ht 5\' 2"  (1.575 m)   Wt 82.7 kg   SpO2 99%   BMI 33.36 kg/m   Labs: Results for orders placed or performed during the hospital encounter of 11/25/17 (from the past 48 hour(s))  CBC     Status: Abnormal   Collection Time: 11/25/17  7:49 AM  Result Value Ref Range   WBC 15.8 (H) 4.0 - 10.5 K/uL   RBC 4.26 4.22 - 5.81 MIL/uL   Hemoglobin 13.5 13.0 - 17.0 g/dL   HCT 39.0 39.0 - 52.0 %   MCV 91.5 78.0 - 100.0 fL   MCH 31.7 26.0 - 34.0 pg   MCHC 34.6 30.0 - 36.0  g/dL   RDW 13.5 11.5 - 15.5 %   Platelets 303 150 - 400 K/uL    Comment: Performed at [facility], [place] [address], [place], [place] [address]    Imaging / Studies: No results found.   Adin Hector, M.D., F.A.C.S. Gastrointestinal and Minimally Invasive Surgery [facility] [address] [place], [place] [address] [phone] Main / Paging  11/25/2017 9:34 AM    Adin Hector

## 2017-11-26 ENCOUNTER — Encounter (HOSPITAL_COMMUNITY): Payer: Self-pay | Admitting: Surgery

## 2017-11-26 DIAGNOSIS — K4020 Bilateral inguinal hernia, without obstruction or gangrene, not specified as recurrent: Secondary | ICD-10-CM

## 2017-11-26 MED ORDER — ACETAMINOPHEN 500 MG PO TABS
1000.0000 mg | ORAL_TABLET | Freq: Four times a day (QID) | ORAL | Status: DC
  Administered 2017-11-26 (×3): 1000 mg via ORAL
  Filled 2017-11-26 (×4): qty 2

## 2017-11-26 MED ORDER — FENTANYL CITRATE (PF) 100 MCG/2ML IJ SOLN: 25.0000 ug | INTRAMUSCULAR | Status: DC | PRN

## 2017-11-26 MED ORDER — BISACODYL 10 MG RE SUPP: 10.0000 mg | Freq: Every day | RECTAL | Status: DC | PRN

## 2017-11-26 MED ORDER — OXYCODONE HCL 5 MG PO TABS
5.0000 mg | ORAL_TABLET | ORAL | Status: DC | PRN
  Administered 2017-11-26: 10 mg via ORAL
  Administered 2017-11-26 – 2017-11-27 (×4): 15 mg via ORAL
  Filled 2017-11-26 (×2): qty 2
  Filled 2017-11-26: qty 3
  Filled 2017-11-26: qty 1
  Filled 2017-11-26 (×2): qty 3

## 2017-11-26 MED ORDER — ALUM & MAG HYDROXIDE-SIMETH 200-200-20 MG/5ML PO SUSP: 30.0000 mL | Freq: Four times a day (QID) | ORAL | Status: DC | PRN

## 2017-11-26 MED ORDER — MAGIC MOUTHWASH
15.0000 mL | Freq: Four times a day (QID) | ORAL | Status: DC | PRN
  Filled 2017-11-26: qty 15

## 2017-11-26 MED ORDER — POLYETHYLENE GLYCOL 3350 17 G PO PACK
17.0000 g | PACK | Freq: Two times a day (BID) | ORAL | Status: DC
  Administered 2017-11-26 – 2017-11-28 (×5): 17 g via ORAL
  Filled 2017-11-26 (×6): qty 1

## 2017-11-26 MED ORDER — MENTHOL 3 MG MT LOZG: 1.0000 | LOZENGE | OROMUCOSAL | Status: DC | PRN

## 2017-11-26 NOTE — Addendum Note (Signed)
Addendum  created 11/26/17 0746 by Lollie Sails, CRNA   Charge Capture section accepted

## 2017-11-26 NOTE — Progress Notes (Signed)
Sean Herring [id number] 07-04-1970  CARE TEAM:  PCP: Sean Baxter, MD  Outpatient Care Team: Patient Care Team: Sean Baxter, MD as PCP - General (Family Medicine) Sean Bellows, MD as Consulting Physician (Gastroenterology) Sean Russel, MD as Consulting Physician (Orthopedic Surgery) Sean Boston, MD as Consulting Physician (General Surgery)  Inpatient Treatment Team: Treatment Team: Attending Provider: Michael Boston, MD; Technician: Sean Herring, Sean Herring, NT   Problem List:   Principal Problem:   Incarcerated incisional hernia s/p lap repair w mesh 11/25/2017 Active Problems:   Depression   Hypertension   Hyperlipidemia   Chronic nausea   Tobacco abuse   Patient difficult to contact   Constipation, chronic   Bilateral inguinal herniae s/p lap repair w mesh 11/25/2017   1 Day Post-Op  11/25/2017  Procedure(s): LAPAROSCOPIC incisional HERNIA WITH LYSIS OF ADHESIONS ERAS PATHWAY LAPAROSCOPIC bilateral inguinal hernia, INSERTION OF MESH for bilateral inguinal hernias and incisional hernia [facility] Stay = 1 days  Assessment  Sore but stable  Plan:  Stop IV fluids.  Adfv diet to solids  Try to encourage more aggressive oral pain management.  Switch to different intravenous narcotics since Dilaudid seems to be sedating  Bowel regimen.  VTE prophylaxis- SCDs, etc  Mobilize as tolerated to help recovery  20 minutes spent in review, evaluation, examination, counseling, and coordination of care.  More than 50% of that time was spent in counseling.  11/26/2017    Subjective: (Chief complaint)  Feeling sore.  IV Dilaudid helps but very sedating.  Not getting out of bed.  Wife at bedside.  Nursing just outside room.  Objective:  Vital signs:  Vitals:   11/25/17 1750 11/25/17 2128 11/26/17 0146 11/26/17 0533  BP: 137/90 131/86 125/79 126/82  Pulse: 66 83 76 78  Resp: 16 18 18 18   Temp: 98.4 F (36.9 C) 98.4 F (36.9 C) 98.4 F  (36.9 C) 97.7 F (36.5 C)  TempSrc: Oral Oral Oral Oral  SpO2: 99% 98% 99% 98%  Weight:      Height:        Last BM Date: 11/24/17  Intake/Output   Yesterday:  09/05 0701 - 09/06 0700 In: 2828.9 [P.O.:480; I.V.:2248.9; IV Piggyback:100] Out: 1884 [Urine:1600; Blood:75] This shift:  No intake/output data recorded.  Bowel function:  Flatus: No  BM:  No  Drain: (No drain)   Physical Exam:  General: Pt awake/alert/oriented x4 in mild acute distress Eyes: PERRL, normal EOM.  Sclera clear.  No icterus Neuro: CN II-XII intact w/o focal sensory/motor deficits. Lymph: No head/neck/groin lymphadenopathy Psych:  No delerium/psychosis/paranoia HENT: Normocephalic, Mucus membranes moist.  No thrush Neck: Supple, No tracheal deviation Chest: No chest wall pain w good excursion CV:  Pulses intact.  Regular rhythm MS: Normal AROM mjr joints.  No obvious deformity  Abdomen: Soft.  Mildy distended.  Mildly tender at incisions only.  No evidence of peritonitis.  No incarcerated hernias.  Ext:   No deformity.  No mjr edema.  No cyanosis Skin: No petechiae / purpura  Results:   Labs: Results for orders placed or performed during the hospital encounter of 11/25/17 (from the past 48 hour(s))  CBC     Status: Abnormal   Collection Time: 11/25/17  7:49 AM  Result Value Ref Range   WBC 15.8 (H) 4.0 - 10.5 K/uL   RBC 4.26 4.22 - 5.81 MIL/uL   Hemoglobin 13.5 13.0 - 17.0 g/dL   HCT 39.0 39.0 - 52.0 %   MCV 91.5  78.0 - 100.0 fL   MCH 31.7 26.0 - 34.0 pg   MCHC 34.6 30.0 - 36.0 g/dL   RDW 13.5 11.5 - 15.5 %   Platelets 303 150 - 400 K/uL    Comment: Performed at [facility], [place] [address] [place], [place] [address]    Imaging / Studies: No results found.  Medications / Allergies: per chart  Antibiotics: Anti-infectives (From admission, onward)   Start     Dose/Rate Route Frequency Ordered Stop   11/25/17 1800  ceFAZolin (ANCEF) IVPB 2g/100 mL premix      2 g 200 mL/hr over 30 Minutes Intravenous Every 8 hours 11/25/17 1605 11/25/17 1907   11/25/17 0745  ceFAZolin (ANCEF) IVPB 2g/100 mL premix     2 g 200 mL/hr over 30 Minutes Intravenous On call to O.R. 11/25/17 0732 11/25/17 1018        Note: Portions of this report may have been transcribed using voice recognition software. Every effort was made to ensure accuracy; however, inadvertent computerized transcription errors may be present.   Any transcriptional errors that result from this process are unintentional.     Adin Hector, MD, FACS, MASCRS Gastrointestinal and Minimally Invasive Surgery    [address] [place], [place] [address] [phone] Main / Paging [phone] Fax

## 2017-11-27 MED ORDER — ESCITALOPRAM OXALATE 20 MG PO TABS
20.0000 mg | ORAL_TABLET | Freq: Every day | ORAL | Status: DC
  Administered 2017-11-28: 20 mg via ORAL
  Filled 2017-11-27 (×2): qty 1

## 2017-11-27 MED ORDER — OXYCODONE HCL 5 MG PO TABS
10.0000 mg | ORAL_TABLET | ORAL | Status: DC | PRN
  Administered 2017-11-27: 15 mg via ORAL
  Administered 2017-11-27 – 2017-11-28 (×5): 20 mg via ORAL
  Filled 2017-11-27: qty 4
  Filled 2017-11-27: qty 3
  Filled 2017-11-27 (×4): qty 4

## 2017-11-27 MED ORDER — GABAPENTIN 300 MG PO CAPS
300.0000 mg | ORAL_CAPSULE | Freq: Three times a day (TID) | ORAL | Status: DC
  Administered 2017-11-27 – 2017-11-28 (×4): 300 mg via ORAL
  Filled 2017-11-27 (×4): qty 1

## 2017-11-27 MED ORDER — METHOCARBAMOL 500 MG PO TABS
750.0000 mg | ORAL_TABLET | Freq: Three times a day (TID) | ORAL | Status: DC
  Administered 2017-11-27 – 2017-11-28 (×4): 750 mg via ORAL
  Filled 2017-11-27 (×4): qty 2

## 2017-11-27 MED ORDER — IBUPROFEN 200 MG PO TABS
600.0000 mg | ORAL_TABLET | Freq: Four times a day (QID) | ORAL | Status: DC
  Administered 2017-11-27 – 2017-11-28 (×5): 600 mg via ORAL
  Filled 2017-11-27 (×5): qty 3

## 2017-11-27 MED ORDER — ACETAMINOPHEN 325 MG PO TABS: 325.0000 mg | ORAL_TABLET | Freq: Four times a day (QID) | ORAL | Status: DC | PRN

## 2017-11-27 NOTE — Progress Notes (Signed)
Progress Note    Sean Herring [id number] 1970/04/01  CARE TEAM:  PCP: Normajean Baxter, MD  Outpatient Care Team: Patient Care Team: Normajean Baxter, MD as PCP - General (Family Medicine) Jonathon Bellows, MD as Consulting Physician (Gastroenterology) Henry Russel, MD as Consulting Physician (Orthopedic Surgery) Michael Boston, MD as Consulting Physician (General Surgery)  Inpatient Treatment Team: Treatment Team: Attending Provider: Michael Boston, MD; Technician: Adrian Saran, Arnette Schaumann, NT; Registered Nurse: Charolette Child, RN   Problem List:   Principal Problem:   Incarcerated incisional hernia s/p lap repair w mesh 11/25/2017 Active Problems:   Hypertension   Hyperlipidemia   Chronic nausea   Depression   Tobacco abuse   Patient difficult to contact   Constipation, chronic   Bilateral inguinal herniae s/p lap repair w mesh 11/25/2017   2 Days Post-Op  11/25/2017  Procedure(s): LAPAROSCOPIC incisional HERNIA WITH LYSIS OF ADHESIONS ERAS PATHWAY LAPAROSCOPIC bilateral inguinal hernia, INSERTION OF MESH for bilateral inguinal hernias and incisional hernia [facility] Stay = 2 days   Assessment:    Patient feels consistent 6/10 pain, increasing to 8-10/10 pain with mobilization. Pt remains mobile, is able to keep food down, is urinating and passing flatus. Overall patient is recovering well and is stable.  Plan:    - Continue mobilization and spirometry to improve recovery. - Continue ice pack use.  - Transition Tylenol to prn - Initiate scheduled Motrin 600 mg QID for pain management. - Robaxin 750 mg TID for pain management. - Increase Gabapentin to TID for increased pain management. - Continue Miralax for bowel regimen. - Goal is get pain under better control to facilitate more comfort for discharge planned for tomorrow.  Family Communication/Anticipated D/C date and plan/Code Status   DVT prophylaxis: SCDs, etc Code Status: Full code Family  Communication: Discussed current plan and state with patient Disposition Plan: Discharge tomorrow, 11/28/17   Antimicrobials:    Anti-infectives (From admission, onward)   Start     Dose/Rate Route Frequency Ordered Stop   11/25/17 1800  ceFAZolin (ANCEF) IVPB 2g/100 mL premix     2 g 200 mL/hr over 30 Minutes Intravenous Every 8 hours 11/25/17 1605 11/25/17 1907   11/25/17 0745  ceFAZolin (ANCEF) IVPB 2g/100 mL premix     2 g 200 mL/hr over 30 Minutes Intravenous On call to O.R. 11/25/17 0732 11/25/17 1018       Subjective:    Patient feeling better overall but still feeling moderate pain post-op which is to be expected at this stage. Pt discussed feeling mild scrotal edema. Mr. Tanguma described having no trouble with passing flatus and with urination. No BM at this time.    Objective:   Vitals:   11/26/17 0533 11/26/17 0954 11/26/17 1415 11/27/17 0558  BP: 126/82 115/84 114/77 136/85  Pulse: 78 70 77 87  Resp: 18 17 16 18   Temp: 97.7 F (36.5 C) 97.8 F (36.6 C) 97.6 F (36.4 C) 98.9 F (37.2 C)  TempSrc: Oral Oral Oral Oral  SpO2: 98% 96% 97% 97%  Weight:      Height:        Last BM Date: 11/24/17  Intake/Output   Yesterday:  09/06 0701 - 09/07 0700 In: 1183 [P.O.:1080; I.V.:103] Out: 2350 [Urine:2350] This shift:  No intake/output data recorded.  Bowel function:  Flatus: YES  BM:  No  Drain: (No drain)  Physical Exam:    General: Pt awake/alert/oriented x4 in no acute distress Skin: no rashes,  lesions, ulcers. No induration Eyes: PERRL, lids and conjunctivae normal ENMT: Mucous membranes are moist. No oropharyngeal exudates Neck: normal, supple, no masses, no thyromegaly Cardiovascular: Regular rate and rhythm, no murmurs / rubs / gallops. No LE edema. 2+ pedal pulses. No carotid bruits.  Respiratory: clear to auscultation bilaterally, no wheezing, no crackles. Normal respiratory effort. No accessory muscle use.  Abdomen: Soft.  Mildy distended.   Mildly tender at incisions only.  No evidence of peritonitis.  No incarcerated hernias. Musculoskeletal: no clubbing / cyanosis. No joint deformity upper and lower extremities. No contractures. Normal muscle tone.  Neurologic: CN 2-12 grossly intact. No focal deficits Psychiatric: Normal judgment and insight. Alert and oriented x 3. Normal mood.    Labs:   No results found for this or any previous visit (from the past 48 hour(s)).   Imaging / Studies: No results found.    Medication:    . enoxaparin (LOVENOX) injection  40 mg Subcutaneous Q24H  . escitalopram  20 mg Oral Daily  . gabapentin  300 mg Oral TID  . ibuprofen  600 mg Oral QID  . lip balm  1 application Topical BID  . methocarbamol  750 mg Oral TID  . nicotine  14 mg Transdermal Daily  . polyethylene glycol  17 g Oral BID  . sodium chloride flush  3 mL Intravenous Q12H  . tamsulosin  0.4 mg Oral Daily    Continuous Infusions: . sodium chloride 250 mL (11/25/17 1645)  . lactated ringers        Time spent: 15 minutes  Signed, Ave Filter, PA-S [place] PA Class of [phone]

## 2017-11-28 MED ORDER — GABAPENTIN 300 MG PO CAPS: 300.0000 mg | ORAL_CAPSULE | Freq: Three times a day (TID) | ORAL | 1 refills | Status: DC

## 2017-11-28 MED ORDER — METHOCARBAMOL 750 MG PO TABS: 750.0000 mg | ORAL_TABLET | Freq: Three times a day (TID) | ORAL | 2 refills | Status: DC | PRN

## 2017-11-28 NOTE — Discharge Instructions (Signed)
HERNIA REPAIR: POST OP INSTRUCTIONS ° °###################################################################### ° °EAT °Gradually transition to a high fiber diet with a fiber supplement over the next few weeks after discharge.  Start with a pureed / full liquid diet (see below) ° °WALK °Walk an hour a day.  Control your pain to do that.   ° °CONTROL PAIN °Control pain so that you can walk, sleep, tolerate sneezing/coughing, and go up/down stairs. ° °HAVE A BOWEL MOVEMENT DAILY °Keep your bowels regular to avoid problems.  OK to try a laxative to override constipation.  OK to use an antidairrheal to slow down diarrhea.  Call if not better after 2 tries ° °CALL IF YOU HAVE PROBLEMS/CONCERNS °Call if you are still struggling despite following these instructions. °Call if you have concerns not answered by these instructions ° °###################################################################### ° ° ° °1. DIET: Follow a light bland diet the first 24 hours after arrival home, such as soup, liquids, crackers, etc.  Be sure to include lots of fluids daily.  Advance to a low fat / high fiber diet over the next few days after surgery.  Avoid fast food or heavy meals the first week as your are more likely to get nauseated.   ° °2. Take your usually prescribed home medications unless otherwise directed. ° °3. PAIN CONTROL: °a. Pain is best controlled by a usual combination of three different methods TOGETHER: °i. Ice/Heat °ii. Over the counter pain medication °iii. Prescription pain medication °b. Most patients will experience some swelling and bruising around the hernia(s) such as the bellybutton, groins, or old incisions.  Ice packs or heating pads (30-60 minutes up to 6 times a day) will help. Use ice for the first few days to help decrease swelling and bruising, then switch to heat to help relax tight/sore spots and speed recovery.  Some people prefer to use ice alone, heat alone, alternating between ice & heat.  Experiment  to what works for you.  Swelling and bruising can take several weeks to resolve.   °c. It is helpful to take an over-the-counter pain medication regularly for the first few weeks.  Choose one of the following that works best for you: °i. Naproxen (Aleve, etc)  Two 220mg tabs twice a day °ii. Ibuprofen (Advil, etc) Three 200mg tabs four times a day (every meal & bedtime) °iii. Acetaminophen (Tylenol, etc) 325-650mg four times a day (every meal & bedtime) °d. A  prescription for pain medication should be given to you upon discharge.  Take your pain medication as prescribed.  °i. If you are having problems/concerns with the prescription medicine (does not control pain, nausea, vomiting, rash, itching, etc), please call us [phone] to see if we need to switch you to a different pain medicine that will work better for you and/or control your side effect better. °ii. If you need a refill on your pain medication, please contact your pharmacy.  They will contact our office to request authorization. Prescriptions will not be filled after 5 pm or on week-ends. ° °4. Avoid getting constipated.  Between the surgery and the pain medications, it is common to experience some constipation.  Increasing fluid intake and taking a fiber supplement (such as Metamucil, Citrucel, FiberCon, MiraLax, etc) 1-2 times a day regularly will usually help prevent this problem from occurring.  A mild laxative (prune juice, Milk of Magnesia, MiraLax, etc) should be taken according to package directions if there are no bowel movements after 48 hours.   ° °5. Wash / shower every   day.  You may shower over the dressings as they are waterproof.    6. Remove your waterproof bandages, skin tapes, and other bandages 5 days after surgery. You may replace a dressing/Band-Aid to cover the incision for comfort if you wish. You may leave the incisions open to air.  You may replace a dressing/Band-Aid to cover an incision for comfort if you wish.   Continue to shower over incision(s) after the dressing is off.  7. ACTIVITIES as tolerated:   a. You may resume regular (light) daily activities beginning the next day--such as daily self-care, walking, climbing stairs--gradually increasing activities as tolerated.  Control your pain so that you can walk an hour a day.  If you can walk 30 minutes without difficulty, it is safe to try more intense activity such as jogging, treadmill, bicycling, low-impact aerobics, swimming, etc. b. Save the most intensive and strenuous activity for last such as sit-ups, heavy lifting, contact sports, etc  Refrain from any heavy lifting or straining until you are off narcotics for pain control.   c. DO NOT PUSH THROUGH PAIN.  Let pain be your guide: If it hurts to do something, don't do it.  Pain is your body warning you to avoid that activity for another week until the pain goes down. d. You may drive when you are no longer taking prescription pain medication, you can comfortably wear a seatbelt, and you can safely maneuver your car and apply brakes. e. Dennis Bast may have sexual intercourse when it is comfortable.   8. FOLLOW UP in our office a. Please call [facility] at [phone] to set up an appointment to see your surgeon in the office for a follow-up appointment approximately 2-3 weeks after your surgery. b. Make sure that you call for this appointment the day you arrive home to insure a convenient appointment time.  9.  If you have disability of FMLA / Family leave forms, please bring the forms to the office for processing.  (do not give to your surgeon).  WHEN TO CALL us [phone]: 1. Poor pain control 2. Reactions / problems with new medications (rash/itching, nausea, etc)  3. Fever over 101.5 F (38.5 C) 4. Inability to urinate 5. Nausea and/or vomiting 6. Worsening swelling or bruising 7. Continued bleeding from incision. 8. Increased pain, redness, or drainage from the incision   The clinic staff is  available to answer your questions during regular business hours (8:30am-5pm).  Please dont hesitate to call and ask to speak to one of our nurses for clinical concerns.   If you have a medical emergency, go to the nearest emergency room or call 911.  A surgeon from [facility] is always on call at the hospitals in [facility], [place], [place], [place], [place]  [address] ?  [address] [address], [place], [place]   [address] MAIN: [phone] ? TOLL FREE: [phone] ? FAX: [phone] [email or web address]  Managing Pain  ######################################################################   CONTROL PAIN Control pain so that you can walk, sleep, tolerate sneezing/coughing, go up/down stairs.  (Good pain control is not pain free only when lying still, unable to move)  WALK Walk an hour a day.  Control your pain to do that.   HAVE A BOWEL MOVEMENT DAILY Keep your bowels regular to avoid problems.  OK to try a laxative to override constipation.  OK to use an antidairrheal to slow down diarrhea.  Call if not better after 2 tries  CALL IF YOU HAVE PROBLEMS/CONCERNS Call if you are still struggling despite following these instructions. Call if you have concerns not answered by these instructions  ######################################################################    Pain after surgery or related to activity is often due to strain/injury to muscle, tendon, nerves and/or incisions.  This pain is usually short-term and will improve in a few months.   Many people find it helpful to do the following things TOGETHER to help speed the process of healing and to get back to regular activity more quickly:  1. Avoid heavy physical activity at first a. No lifting greater than 20 pounds at first, then increase to lifting as tolerated over the next few weeks b. Do not push through the pain.  Listen to your body and avoid positions  and maneuvers than reproduce the pain.  Wait a few days before trying something more intense c. Walking is okay as tolerated, but go slowly and stop when getting sore.  If you can walk 30 minutes without stopping or pain, you can try more intense activity (running, jogging, aerobics, cycling, swimming, treadmill, sex, sports, weightlifting, etc ) d. Remember: If it hurts to do it, then dont do it!  2. Take Anti-inflammatory medication a. Choose ONE of the following over-the-counter medications: i.            Acetaminophen 500mg  tabs (Tylenol) 1-2 pills with every meal and just before bedtime (avoid if you have liver problems) ii.            Naproxen 220mg  tabs (ex. Aleve) 1-2 pills twice a day (avoid if you have kidney, stomach, IBD, or bleeding problems) iii. Ibuprofen 200mg  tabs (ex. Advil, Motrin) 3-4 pills with every meal and just before bedtime (avoid if you have kidney, stomach, IBD, or bleeding problems) b. Take with food/snack around the clock for 1-2 weeks i. This helps the muscle and nerve tissues become less irritable and calm down faster  3. Use a Heating pad or Ice/Cold Pack a. 4-6 times a day b. May use warm bath/hottub  or showers  4. Try Gentle Massage and/or Stretching  a. at the area of pain many times a day b. stop if you feel pain - do not overdo it  Try these steps together to help you body heal faster and avoid making things get worse.  Doing just one of these things may not be enough.    If you are not getting better after two weeks or are noticing you are getting worse, contact our office for further advice; we may need to re-evaluate you & see what other things we can do to help.

## 2017-11-28 NOTE — Progress Notes (Signed)
Nurse reviewed discharge instructions with pt.  Pt verbalized understanding of discharge instructions, follow up appointments and new medications.  Prescription given to pt prior to discharge.   

## 2017-11-28 NOTE — Discharge Summary (Signed)
Physician Discharge Summary    Patient ID: Sean Herring MRN: [id number] DOB/AGE: [birth year]  47 y.o.  Admit date: 11/25/2017 Discharge date: 11/28/2017   Hospital Stay = 3 days  Patient Care Team: Normajean Baxter, MD as PCP - General (Family Medicine) Jonathon Bellows, MD as Consulting Physician (Gastroenterology) Henry Russel, MD as Consulting Physician (Orthopedic Surgery) Michael Boston, MD as Consulting Physician (General Surgery)  Discharge Diagnoses:  Principal Problem:   Incarcerated incisional hernia s/p lap repair w mesh 11/25/2017 Active Problems:   Depression   Hypertension   Hyperlipidemia   Chronic nausea   Tobacco abuse   Patient difficult to contact   Constipation, chronic   Bilateral inguinal herniae s/p lap repair w mesh 11/25/2017   3 Days Post-Op  11/25/2017  POST-OPERATIVE DIAGNOSIS:    Bilateral inguinal hernias Incarcerated incisional hernias  PROCEDURE:    LAPAROSCOPIC INCISIONAL HERNIA REPAIR  LAPAROSCOPIC LYSIS OF ADHESIONS LAPAROSCOPIC INGUINAL HERNIA REPAIR WITH MESH INSERTION OF MESH   SURGEON:  Adin Hector, MD  Consults: None  Hospital Course:   Pleasant patient with numerous incisional and inguinal hernias.  Required extensive laparoscopic surgery and mesh placement to patch the regions.  Postoperatively, the patient gradually mobilized and advanced to a solid diet.  Pain and other symptoms were treated aggressively.   Initially he required IV medications.  Gradually we were able to get him under better control with oral medications.  By the time of discharge, the patient was walking well the hallways, eating food, having flatus.  Pain was well-controlled on an oral medications.  Based on meeting discharge criteria and continuing to recover, I felt it was safe for the patient to be discharged from the hospital to further recover with close followup. Postoperative recommendations were discussed in detail.  They are written as  well.  Discharged Condition: good  Disposition:  Follow-up Information    Michael Boston, MD In 3 weeks.   Specialty:  General Surgery Why:  To follow up after your operation Contact information: [place] [address] [phone]           Discharge disposition: 01-Home or Self Care       Discharge Instructions    Call MD for:   Complete by:  As directed    FEVER >101.5 F (Temperatures <101.66F occasionally happen and are not significant)   Call MD for:   Complete by:  As directed    FEVER > 101.5 F  (temperatures < 101.5 F are not significant)   Call MD for:  extreme fatigue   Complete by:  As directed    Call MD for:  extreme fatigue   Complete by:  As directed    Call MD for:  persistant dizziness or light-headedness   Complete by:  As directed    Call MD for:  persistant dizziness or light-headedness   Complete by:  As directed    Call MD for:  persistant nausea and vomiting   Complete by:  As directed    Call MD for:  persistant nausea and vomiting   Complete by:  As directed    Call MD for:  redness, tenderness, or signs of infection (pain, swelling, redness, odor or green/yellow discharge around incision site)   Complete by:  As directed    Call MD for:  redness, tenderness, or signs of infection (pain, swelling, redness, odor or green/yellow discharge around incision site)   Complete by:  As directed  Call MD for:  severe uncontrolled pain   Complete by:  As directed    Call MD for:  severe uncontrolled pain   Complete by:  As directed    Diet - low sodium heart healthy   Complete by:  As directed    Follow a light diet the first few days at home.  Start with a bland diet such as soups, liquids, starchy foods, low fat foods, etc.   If you feel full, bloated, or constipated, stay on a full liquid or pureed/blenderized diet for a few days until you feel better and no longer constipated. Gradually get back to a regular solid  diet.  Avoid fast food or heavy meals the first week as you are more likely to get nauseated.   Diet - low sodium heart healthy   Complete by:  As directed    Start with a bland diet such as soups, liquids, starchy foods, low fat foods, etc. the first few days at home. Gradually advance to a solid, low-fat, high fiber diet by the end of the first week at home.   Add a fiber supplement to your diet (Metamucil, etc) If you feel full, bloated, or constipated, stay on a full liquid or pureed/blenderized diet for a few days until you feel better and are no longer constipated.   Discharge instructions   Complete by:  As directed    One the day of your discharge from the hospital (or the next business weekday), please call [facility] to set up or confirm an appointment to see your surgeon in the office for a follow-up appointment.  Usually it is 2-3 weeks after your surgery.  Other concerns If you are not getting better after two weeks or are noticing you are getting worse, contact our office [phone] for further advice.  We may need to adjust your medications, re-evaluate you in the office, send you to the emergency room, or see what other things we can do to help. The clinic staff is available to answer your questions during regular business hours (8:30am-5pm).  Please don't hesitate to call and ask to speak to one of our nurses for clinical concerns.    A surgeon from [facility] is always on call at the hospitals 24 hours/day If you have a medical emergency, go to the nearest emergency room or call 911.   Discharge instructions   Complete by:  As directed    See Discharge Instructions If you are not getting better after two weeks or are noticing you are getting worse, contact our office [phone] for further advice.  We may need to adjust your medications, re-evaluate you in the office, send you to the emergency room, or see what other things we can do to  help. The clinic staff is available to answer your questions during regular business hours (8:30am-5pm).  Please don't hesitate to call and ask to speak to one of our nurses for clinical concerns.    A surgeon from [facility] is always on call at the hospitals 24 hours/day If you have a medical emergency, go to the nearest emergency room or call 911.   Discharge wound care:   Complete by:  As directed    It is good for closed incisions and even open wounds to be washed every day.  Shower every day.  Short baths are fine.  Wash the incisions and wounds clean with soap & water.   You may replace  dressings as needed.  Remove all dressings and skin tapes off your abdomen Wednesday, 9/11  You may leave closed incisions open to air if it is dry.   You may cover the incision with clean gauze & replace it after your daily shower for comfort.   Driving Restrictions   Complete by:  As directed    You may drive when you are no longer taking prescription pain medication, you can comfortably wear a seatbelt, and you can safely maneuver your car and apply brakes.   Driving Restrictions   Complete by:  As directed    You may drive when: - you are no longer taking narcotic prescription pain medication - you can comfortably wear a seatbelt - you can safely make sudden turns/stops without pain.   Increase activity slowly   Complete by:  As directed    Increase activity slowly   Complete by:  As directed    Start light daily activities --- self-care, walking, climbing stairs- beginning the day after surgery.  Gradually increase activities as tolerated.  Control your pain to be active.  Stop when you are tired.  Ideally, walk several times a day, eventually an hour a day.   Most people are back to most day-to-day activities in a few weeks.  It takes 4-6 weeks to get back to unrestricted, intense activity. If you can walk 30 minutes without difficulty, it is safe to try more intense activity such  as jogging, treadmill, bicycling, low-impact aerobics, swimming, etc. Save the most intensive and strenuous activity for last (Usually 4-8 weeks after surgery) such as sit-ups, heavy lifting, contact sports, etc.  Refrain from any intense heavy lifting or straining until you are off narcotics for pain control.  You will have off days, but things should improve week-by-week. DO NOT PUSH THROUGH PAIN.  Let pain be your guide: If it hurts to do something, don't do it.   Lifting restrictions   Complete by:  As directed    You may resume regular (light) daily activities beginning the next day-such as daily self-care, walking, climbing stairs-gradually increasing activities as tolerated.   If you can walk 30 minutes without difficulty, it is safe to try more intense activity such as jogging, treadmill, bicycling, low-impact aerobics, swimming, etc. Save the most intensive and strenuous activity for last such as sit-ups, heavy lifting, contact sports, etc   Refrain from any heavy lifting or straining until you are off narcotics for pain control.   DO NOT PUSH THROUGH PAIN.   Let pain be your guide: If it hurts to do something, don't do it.   Pain is your body warning you to avoid that activity for another week until the pain goes down.   Lifting restrictions   Complete by:  As directed    If you can walk 30 minutes without difficulty, it is safe to try more intense activity such as jogging, treadmill, bicycling, low-impact aerobics, swimming, etc. Save the most intensive and strenuous activity for last (Usually 4-8 weeks after surgery) such as sit-ups, heavy lifting, contact sports, etc.   Refrain from any intense heavy lifting or straining until you are off narcotics for pain control.  You will have off days, but things should improve week-by-week. DO NOT PUSH THROUGH PAIN.  Let pain be your guide: If it hurts to do something, don't do it.  Pain is your body warning you to avoid that activity for another  week until the pain goes down.   May  shower / Bathe   Complete by:  As directed    Wash / shower every day.  You may shower over the dressings as they are waterproof.  Continue to shower over incision(s) after the dressing is off.   May shower / Bathe   Complete by:  As directed    May walk up steps   Complete by:  As directed    May walk up steps   Complete by:  As directed    Remove dressing in 72 hours   Complete by:  As directed    Remove your waterproof dressings, skin tapes, and other bandages 5 days after surgery.    You may leave the incision(s) open to air.   You may replace a dressing/Band-Aid to cover the incision for comfort if you wish.   Sexual Activity Restrictions   Complete by:  As directed    You may have sexual intercourse when it is comfortable. If it hurts to do something, stop.   Sexual Activity Restrictions   Complete by:  As directed    You may have sexual intercourse when it is comfortable. If it hurts to do something, stop.      Allergies as of 11/28/2017   No Known Allergies     Medication List    STOP taking these medications   ondansetron 4 MG disintegrating tablet Commonly known as:  ZOFRAN-ODT     TAKE these medications   acetaminophen 500 MG tablet Commonly known as:  TYLENOL Take 1,000 mg by mouth every 8 (eight) hours as needed for moderate pain or headache.   albuterol 108 (90 Base) MCG/ACT inhaler Commonly known as:  PROVENTIL HFA;VENTOLIN HFA Inhale 1-2 puffs into the lungs every 6 (six) hours as needed for wheezing or shortness of breath.   escitalopram 10 MG tablet Commonly known as:  LEXAPRO Take 1 tablet (10 mg total) by mouth daily.   fluticasone 50 MCG/ACT nasal spray Commonly known as:  FLONASE Place 2 sprays into both nostrils daily. Use for 4-6 weeks then stop and use seasonally or as needed.   gabapentin 300 MG capsule Commonly known as:  NEURONTIN Take 1 capsule (300 mg total) by mouth 3 (three) times daily.    ibuprofen 200 MG tablet Commonly known as:  ADVIL,MOTRIN Take 600 mg by mouth every 8 (eight) hours as needed for headache or moderate pain.   methocarbamol 750 MG tablet Commonly known as:  ROBAXIN Take 1 tablet (750 mg total) by mouth every 8 (eight) hours as needed for muscle spasms.   naproxen 500 MG tablet Commonly known as:  NAPROSYN Take 1 tablet (500 mg total) by mouth every 12 (twelve) hours as needed for mild pain or moderate pain.   oxyCODONE 5 MG immediate release tablet Commonly known as:  Oxy IR/ROXICODONE Take 1-2 tablets (5-10 mg total) by mouth every 6 (six) hours as needed for moderate pain, severe pain or breakthrough pain.   tamsulosin 0.4 MG Caps capsule Commonly known as:  FLOMAX Take 1 capsule (0.4 mg total) by mouth daily.   tiotropium 18 MCG inhalation capsule Commonly known as:  SPIRIVA Place 1 capsule (18 mcg total) into inhaler and inhale daily.            Discharge Care Instructions  (From admission, onward)         Start     Ordered   11/28/17 0000  Discharge wound care:    Comments:  It is good for closed incisions and  even open wounds to be washed every day.  Shower every day.  Short baths are fine.  Wash the incisions and wounds clean with soap & water.   You may replace dressings as needed.  Remove all dressings and skin tapes off your abdomen Wednesday, 9/11  You may leave closed incisions open to air if it is dry.   You may cover the incision with clean gauze & replace it after your daily shower for comfort.   11/28/17 0812          Significant Diagnostic Studies:  No results found for this or any previous visit (from the past 72 hour(s)).  No results found.  Discharge Exam: Blood pressure 134/83, pulse 86, temperature 98.6 F (37 C), temperature source Oral, resp. rate 18, height 5\' 2"  (1.575 m), weight 82.7 kg, SpO2 97 %.  General: Pt awake/alert/oriented x4 in No acute distress Eyes: PERRL, normal EOM.  Sclera clear.   No icterus Neuro: CN II-XII intact w/o focal sensory/motor deficits. Lymph: No head/neck/groin lymphadenopathy Psych:  No delerium/psychosis/paranoia HENT: Normocephalic, Mucus membranes moist.  No thrush Neck: Supple, No tracheal deviation Chest: No chest wall pain w good excursion CV:  Pulses intact.  Regular rhythm MS: Normal AROM mjr joints.  No obvious deformity Abdomen: Soft.  Nondistended.  Mildly tender at incisions only.  Incisions clean dry and intact.  Dressings dry with old blood periumbilically.  No evidence of peritonitis.  No incarcerated hernias. Ext:  SCDs BLE.  No mjr edema.  No cyanosis Skin: No petechiae / purpura  Past Medical History:  Diagnosis Date  . Anxiety   . Biceps tendon rupture 04/2011   s/p repair  . Bipolar 1 disorder (HCC)    no current meds x 8 years  . 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 Depression   . Diverticulitis    s/p colon surgery w/ostomy  . GERD (gastroesophageal reflux disease)   . HTN (hypertension)    off all bp meds x 8 years due to weight loss  . Hyperlipidemia   . Nausea last 6 months   due to hernias  . Syncope and collapse 2011 or 2-12 due to bp meds    Past Surgical History:  Procedure Laterality Date  . APPENDECTOMY  2010  . BICEPS TENDON REPAIR Right 04/2011  . COLECTOMY WITH COLOSTOMY CREATION/HARTMANN PROCEDURE  01/2009   perf diverticulitis.  [facility] Dr Elta Guadel[place] Bird/Dr Geryl Rankins  . COLOSTOMY TAKEDOWN  07/2009   [facility]  . INGUINAL HERNIA REPAIR Right 11/25/2017   Procedure: LAPAROSCOPIC bilateral inguinal hernia,;  Surgeon: Michael Boston, MD;  Location: [facility];  Service: General;  Laterality: Right;  . INSERTION OF MESH Bilateral 11/25/2017   Procedure: INSERTION OF MESH for bilateral inguinal hernias and incisional hernia X3;  Surgeon: Michael Boston, MD;  Location: [facility];  Service: General;  Laterality: Bilateral;  . ROTATOR CUFF REPAIR Right 2011   Right shoulder  2011, and Left shoulder 2015  . ROTATOR CUFF REPAIR Left 2015  . VENTRAL HERNIA REPAIR N/A 11/25/2017   Procedure: LAPAROSCOPIC incisional HERNIA WITH LYSIS OF ADHESIONS ERAS PATHWAY;  Surgeon: Michael Boston, MD;  Location: [facility];  Service: General;  Laterality: N/A;  BILATERAL TAP BLOCK    Social History   Socioeconomic History  . Marital status: Married    Spouse name: Not on file  . Number of children: 3  . Years of education: Not on file  . Highest education  level: Not on file  Occupational History  . Occupation: Personal assistant: [employer]  Social Needs  . Financial resource strain: Not on file  . Food insecurity:    Worry: Not on file    Inability: Not on file  . Transportation needs:    Medical: Not on file    Non-medical: Not on file  Tobacco Use  . Smoking status: Current Every Day Smoker    Packs/day: 1.00    Years: 30.00    Pack years: 30.00    Types: Cigarettes  . Smokeless tobacco: Never Used  Substance and Sexual Activity  . Alcohol use: No    Comment: rare  . Drug use: Not Currently    Types: Marijuana    Comment: marijuana in past  . Sexual activity: Not on file  Lifestyle  . Physical activity:    Days per week: Not on file    Minutes per session: Not on file  . Stress: Not on file  Relationships  . Social connections:    Talks on phone: Not on file    Gets together: Not on file    Attends religious service: Not on file    Active member of club or organization: Not on file    Attends meetings of clubs or organizations: Not on file    Relationship status: Not on file  . Intimate partner violence:    Fear of current or ex partner: Not on file    Emotionally abused: Not on file    Physically abused: Not on file    Forced sexual activity: Not on file  Other Topics Concern  . Not on file  Social History Narrative   Lives in [place] with wife, 3 children 21YO,18YO,15YO. Works - Building control surveyor.    Family History  Problem Relation  Age of Onset  . Depression Mother   . Heart disease Maternal Uncle   . Heart attack Maternal Grandmother   . Heart attack Maternal Grandfather     Current Facility-Administered Medications  Medication Dose Route Frequency Provider Last Rate Last Dose  . 0.9 %  sodium chloride infusion  250 mL Intravenous PRN Michael Boston, MD 10 mL/hr at 11/25/17 1645 250 mL at 11/25/17 1645  . acetaminophen (TYLENOL) tablet 325-650 mg  325-650 mg Oral Q6H PRN Michael Boston, MD      . albuterol (PROVENTIL) (2.5 MG/3ML) 0.083% nebulizer solution 3 mL  3 mL Inhalation Q6H PRN Michael Boston, MD      . alum & mag hydroxide-simeth (MAALOX/MYLANTA) 200-200-20 MG/5ML suspension 30 mL  30 mL Oral Q6H PRN Minda Ditto, RPH      . bisacodyl (DULCOLAX) suppository 10 mg  10 mg Rectal Daily PRN Minda Ditto, RPH      . diphenhydrAMINE (BENADRYL) 12.5 MG/5ML elixir 12.5 mg  12.5 mg Oral Q6H PRN Michael Boston, MD       Or  . diphenhydrAMINE (BENADRYL) injection 12.5 mg  12.5 mg Intravenous Q6H PRN Michael Boston, MD      . enoxaparin (LOVENOX) injection 40 mg  40 mg Subcutaneous Q24H Michael Boston, MD   40 mg at 11/27/17 0844  . escitalopram (LEXAPRO) tablet 20 mg  20 mg Oral Daily Michael Boston, MD      . fentaNYL (SUBLIMAZE) injection 25-50 mcg  25-50 mcg Intravenous Q3H PRN Michael Boston, MD      . gabapentin (NEURONTIN) capsule 300 mg  300 mg Oral TID Michael Boston, MD   300 mg  at 11/27/17 2104  . guaiFENesin-dextromethorphan (ROBITUSSIN DM) 100-10 MG/5ML syrup 10 mL  10 mL Oral Q4H PRN Michael Boston, MD      . hydrALAZINE (APRESOLINE) injection 5-20 mg  5-20 mg Intravenous Q4H PRN Michael Boston, MD      . hydrocortisone (ANUSOL-HC) 2.5 % rectal cream 1 application  1 application Topical QID PRN Michael Boston, MD      . hydrocortisone cream 1 % 1 application  1 application Topical TID PRN Michael Boston, MD      . ibuprofen (ADVIL,MOTRIN) tablet 600 mg  600 mg Oral QID Michael Boston, MD   600 mg at 11/27/17 2104  .  lip balm (CARMEX) ointment 1 application  1 application Topical BID Michael Boston, MD   1 application at 15/05/69 2104  . magic mouthwash  15 mL Oral QID PRN Minda Ditto, RPH      . menthol-cetylpyridinium (CEPACOL) lozenge 3 mg  1 lozenge Oral PRN Minda Ditto, RPH      . methocarbamol (ROBAXIN) tablet 750 mg  750 mg Oral TID Michael Boston, MD   750 mg at 11/27/17 2104  . metoprolol tartrate (LOPRESSOR) injection 5 mg  5 mg Intravenous Q6H PRN Michael Boston, MD      . nicotine (NICODERM CQ - dosed in mg/24 hours) patch 14 mg  14 mg Transdermal Daily Michael Boston, MD   14 mg at 11/27/17 1129  . ondansetron (ZOFRAN-ODT) disintegrating tablet 4 mg  4 mg Oral Q6H PRN Michael Boston, MD       Or  . ondansetron [facility]) injection 4 mg  4 mg Intravenous Q6H PRN Michael Boston, MD      . oxyCODONE (Oxy IR/ROXICODONE) immediate release tablet 10-20 mg  10-20 mg Oral Q4H PRN Michael Boston, MD   20 mg at 11/28/17 0517  . phenol (CHLORASEPTIC) mouth spray 1-2 spray  1-2 spray Mouth/Throat PRN Michael Boston, MD      . polyethylene glycol (MIRALAX / GLYCOLAX) packet 17 g  17 g Oral Q12H PRN Michael Boston, MD      . polyethylene glycol (MIRALAX / GLYCOLAX) packet 17 g  17 g Oral BID Michael Boston, MD   17 g at 11/27/17 2104  . prochlorperazine (COMPAZINE) tablet 10 mg  10 mg Oral Q6H PRN Michael Boston, MD       Or  . prochlorperazine (COMPAZINE) injection 5-10 mg  5-10 mg Intravenous Q6H PRN Michael Boston, MD      . simethicone (MYLICON) chewable tablet 40 mg  40 mg Oral Q6H PRN Michael Boston, MD      . sodium chloride flush (NS) 0.9 % injection 3 mL  3 mL Intravenous Gorden Harms, MD   3 mL at 11/27/17 2105  . sodium chloride flush (NS) 0.9 % injection 3 mL  3 mL Intravenous PRN Michael Boston, MD      . tamsulosin [facility]) capsule 0.4 mg  0.4 mg Oral Daily Michael Boston, MD   0.4 mg at 11/27/17 1126     No Known Allergies  Signed: Morton Peters, MD, FACS,  [facility]    [address] [place], [place] [address] [phone] Main / Paging [phone] Fax   11/28/2017, 8:12 AM

## 2018-05-14 ENCOUNTER — Other Ambulatory Visit: Payer: Self-pay

## 2018-05-14 ENCOUNTER — Encounter: Payer: Self-pay | Admitting: Emergency Medicine

## 2018-05-14 ENCOUNTER — Inpatient Hospital Stay
Admission: EM | Admit: 2018-05-14 | Discharge: 2018-05-18 | DRG: 854 | Disposition: A | Discharge status: Home / Self Care | Payer: 59 | Attending: Internal Medicine | Admitting: Internal Medicine

## 2018-05-14 ENCOUNTER — Emergency Department: Payer: 59

## 2018-05-14 DIAGNOSIS — F319 Bipolar disorder, unspecified: Secondary | ICD-10-CM | POA: Diagnosis present

## 2018-05-14 DIAGNOSIS — A419 Sepsis, unspecified organism: Principal | ICD-10-CM | POA: Diagnosis present

## 2018-05-14 DIAGNOSIS — M109 Gout, unspecified: Secondary | ICD-10-CM | POA: Diagnosis present

## 2018-05-14 DIAGNOSIS — M79641 Pain in right hand: Secondary | ICD-10-CM | POA: Diagnosis not present

## 2018-05-14 DIAGNOSIS — L0390 Cellulitis, unspecified: Secondary | ICD-10-CM

## 2018-05-14 DIAGNOSIS — L03113 Cellulitis of right upper limb: Secondary | ICD-10-CM

## 2018-05-14 DIAGNOSIS — Z79899 Other long term (current) drug therapy: Secondary | ICD-10-CM

## 2018-05-14 DIAGNOSIS — I10 Essential (primary) hypertension: Secondary | ICD-10-CM | POA: Diagnosis present

## 2018-05-14 DIAGNOSIS — Z818 Family history of other mental and behavioral disorders: Secondary | ICD-10-CM

## 2018-05-14 DIAGNOSIS — S62602A Fracture of unspecified phalanx of right middle finger, initial encounter for closed fracture: Secondary | ICD-10-CM | POA: Diagnosis present

## 2018-05-14 DIAGNOSIS — F17210 Nicotine dependence, cigarettes, uncomplicated: Secondary | ICD-10-CM | POA: Diagnosis present

## 2018-05-14 DIAGNOSIS — M009 Pyogenic arthritis, unspecified: Secondary | ICD-10-CM | POA: Diagnosis present

## 2018-05-14 DIAGNOSIS — E785 Hyperlipidemia, unspecified: Secondary | ICD-10-CM | POA: Diagnosis present

## 2018-05-14 DIAGNOSIS — L02511 Cutaneous abscess of right hand: Secondary | ICD-10-CM | POA: Diagnosis present

## 2018-05-14 DIAGNOSIS — S62609A Fracture of unspecified phalanx of unspecified finger, initial encounter for closed fracture: Secondary | ICD-10-CM | POA: Diagnosis present

## 2018-05-14 DIAGNOSIS — Z9049 Acquired absence of other specified parts of digestive tract: Secondary | ICD-10-CM

## 2018-05-14 DIAGNOSIS — K219 Gastro-esophageal reflux disease without esophagitis: Secondary | ICD-10-CM | POA: Diagnosis present

## 2018-05-14 DIAGNOSIS — Z791 Long term (current) use of non-steroidal anti-inflammatories (NSAID): Secondary | ICD-10-CM

## 2018-05-14 DIAGNOSIS — Z8249 Family history of ischemic heart disease and other diseases of the circulatory system: Secondary | ICD-10-CM

## 2018-05-14 LAB — LACTIC ACID, PLASMA: LACTIC ACID, VENOUS: 1.8 mmol/L (ref 0.5–1.9)

## 2018-05-14 LAB — COMPREHENSIVE METABOLIC PANEL
ALK PHOS: 86 U/L (ref 38–126)
ALT: 32 U/L (ref 0–44)
ANION GAP: 8 (ref 5–15)
AST: 20 U/L (ref 15–41)
Albumin: 3.8 g/dL (ref 3.5–5.0)
BILIRUBIN TOTAL: 0.4 mg/dL (ref 0.3–1.2)
BUN: 11 mg/dL (ref 6–20)
CO2: 23 mmol/L (ref 22–32)
CREATININE: 0.63 mg/dL (ref 0.61–1.24)
Calcium: 8.6 mg/dL — ABNORMAL LOW (ref 8.9–10.3)
Chloride: 105 mmol/L (ref 98–111)
GFR calc Af Amer: >60 mL/min (ref >60)
GFR calc non Af Amer: >60 mL/min (ref >60)
Glucose, Bld: 136 mg/dL — ABNORMAL HIGH (ref 70–99)
Potassium: 3.8 mmol/L (ref 3.5–5.1)
SODIUM: 136 mmol/L (ref 135–145)
TOTAL PROTEIN: 6.8 g/dL (ref 6.5–8.1)

## 2018-05-14 LAB — CBC WITH DIFFERENTIAL/PLATELET
ABS IMMATURE GRANULOCYTES: 0.17 10*3/uL — AB (ref 0.00–0.07)
BASOS PCT: 0 %
Basophils Absolute: 0.1 10*3/uL (ref 0.0–0.1)
EOS ABS: 0.4 10*3/uL (ref 0.0–0.5)
Eosinophils Relative: 2 %
HEMATOCRIT: 38.8 % — AB (ref 39.0–52.0)
Hemoglobin: 13.1 g/dL (ref 13.0–17.0)
IMMATURE GRANULOCYTES: 1 %
LYMPHS ABS: 2.9 10*3/uL (ref 0.7–4.0)
Lymphocytes Relative: 14 %
MCH: 31 pg (ref 26.0–34.0)
MCHC: 33.8 g/dL (ref 30.0–36.0)
MCV: 91.9 fL (ref 80.0–100.0)
MONO ABS: 1.3 10*3/uL — AB (ref 0.1–1.0)
Monocytes Relative: 7 %
NEUTROS ABS: 15.7 10*3/uL — AB (ref 1.7–7.7)
Neutrophils Relative %: 76 %
PLATELETS: 325 10*3/uL (ref 150–400)
RBC: 4.22 MIL/uL (ref 4.22–5.81)
RDW: 13.7 % (ref 11.5–15.5)
WBC: 20.5 10*3/uL — ABNORMAL HIGH (ref 4.0–10.5)
nRBC: 0 % (ref 0.0–0.2)

## 2018-05-14 MED ORDER — VANCOMYCIN HCL 10 G IV SOLR
1250.0000 mg | Freq: Two times a day (BID) | INTRAVENOUS | Status: DC
  Administered 2018-05-15 – 2018-05-18 (×7): 1250 mg via INTRAVENOUS
  Filled 2018-05-14 (×10): qty 1250

## 2018-05-14 MED ORDER — METRONIDAZOLE IN NACL 5-0.79 MG/ML-% IV SOLN
500.0000 mg | Freq: Three times a day (TID) | INTRAVENOUS | Status: DC
  Administered 2018-05-14: 500 mg via INTRAVENOUS
  Filled 2018-05-14 (×3): qty 100

## 2018-05-14 MED ORDER — LIDOCAINE HCL (PF) 1 % IJ SOLN
INTRAMUSCULAR | Status: AC
  Administered 2018-05-14: 5 mL via INTRADERMAL
  Filled 2018-05-14: qty 5

## 2018-05-14 MED ORDER — SODIUM CHLORIDE 0.9 % IV SOLN
2.0000 g | Freq: Two times a day (BID) | INTRAVENOUS | Status: DC
  Administered 2018-05-15 – 2018-05-18 (×7): 2 g via INTRAVENOUS
  Filled 2018-05-14 (×8): qty 2

## 2018-05-14 MED ORDER — SODIUM CHLORIDE 0.9 % IV BOLUS
1000.0000 mL | Freq: Once | INTRAVENOUS | Status: AC
  Administered 2018-05-14: 1000 mL via INTRAVENOUS

## 2018-05-14 MED ORDER — VANCOMYCIN HCL IN DEXTROSE 1-5 GM/200ML-% IV SOLN
1000.0000 mg | Freq: Once | INTRAVENOUS | Status: DC
  Administered 2018-05-14: 1000 mg via INTRAVENOUS
  Filled 2018-05-14: qty 200

## 2018-05-14 MED ORDER — LIDOCAINE HCL (PF) 1 % IJ SOLN
5.0000 mL | Freq: Once | INTRAMUSCULAR | Status: AC
  Administered 2018-05-14: 5 mL via INTRADERMAL

## 2018-05-14 MED ORDER — SODIUM CHLORIDE 0.9 % IV SOLN
2.0000 g | Freq: Once | INTRAVENOUS | Status: AC
  Administered 2018-05-14: 2 g via INTRAVENOUS
  Filled 2018-05-14: qty 2

## 2018-05-14 MED ORDER — HYDROMORPHONE HCL 1 MG/ML IJ SOLN
1.0000 mg | Freq: Once | INTRAMUSCULAR | Status: AC
  Administered 2018-05-14: 1 mg via INTRAVENOUS
  Filled 2018-05-14: qty 1

## 2018-05-14 NOTE — ED Notes (Signed)
dsd applied after I&d attempt. Medications ordered.

## 2018-05-14 NOTE — ED Provider Notes (Signed)
[facility] Emergency Department Provider Note  ____________________________________________  Time seen: Approximately 11:01 PM  I have reviewed the triage vital signs and the nursing notes.   HISTORY  Chief Complaint Hand Pain    HPI Sean Herring is a 48 y.o. male with a history of hypertension hyperlipidemia who comes the ED complaining of pain and swelling of his right hand, started 4 days ago, gradually.  Over the second and third knuckles.  Denies recent injuries or wounds.  No fever or chills.  He works as a Building control surveyor.  He saw his primary care doctor and urgent care over the last few days, was started on Bactrim 2 days ago but reports that it continues to worsen.  Pain is constant, severe, nonradiating.  Worse with movement, no alleviating factors.  Redness over the initial area of the knuckles has now extended up into the wrist.      Past Medical History:  Diagnosis Date  . Anxiety   . Biceps tendon rupture 04/2011   s/p repair  . Bipolar 1 disorder (HCC)    no current meds x 8 years  . 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 Depression   . Diverticulitis    s/p colon surgery w/ostomy  . GERD (gastroesophageal reflux disease)   . HTN (hypertension)    off all bp meds x 8 years due to weight loss  . Hyperlipidemia   . Nausea last 6 months   due to hernias  . Syncope and collapse 2011 or 2-12 due to bp meds     Patient Active Problem List   Diagnosis Date Noted  . Bilateral inguinal herniae s/p lap repair w mesh 11/25/2017 11/26/2017  . Incarcerated incisional hernia s/p lap repair w mesh 11/25/2017 11/25/2017  . Tobacco abuse 10/08/2017  . Patient difficult to contact 10/08/2017  . Constipation, chronic 10/08/2017  . History of no-show for appointments 10/08/2017  . Bipolar 1 disorder ([place])   . Left rotator cuff tear arthropathy 03/12/2015  . Left shoulder pain 06/26/2014  .  Chronic abdominal pain 08/24/2013  . Fatigue 08/24/2013  . Depression 08/24/2013  . Chronic nausea 09/13/2012  . Family history of heart disease in male family member before age 100 11/03/2011  . Hyperlipidemia 07/17/2011  . Hypertension 06/16/2011  . History of colonic diverticulitis 03/23/2009     Past Surgical History:  Procedure Laterality Date  . APPENDECTOMY  2010  . BICEPS TENDON REPAIR Right 04/2011  . COLECTOMY WITH COLOSTOMY CREATION/HARTMANN PROCEDURE  01/2009   perf diverticulitis.  [facility] Dr Elta Guadeloupe Bird/Dr Geryl Rankins  . COLOSTOMY TAKEDOWN  07/2009   [facility]  . INGUINAL HERNIA REPAIR Right 11/25/2017   Procedure: LAPAROSCOPIC bilateral inguinal hernia,;  Surgeon: Michael Boston, MD;  Location: [facility];  Service: General;  Laterality: Right;  . INSERTION OF MESH Bilateral 11/25/2017   Procedure: INSERTION OF MESH for bilateral inguinal hernias and incisional hernia X3;  Surgeon: Michael Boston, MD;  Location: [facility];  Service: General;  Laterality: Bilateral;  . ROTATOR CUFF REPAIR Right 2011   Right shoulder 2011, and Left shoulder 2015  . ROTATOR CUFF REPAIR Left 2015  . VENTRAL HERNIA REPAIR N/A 11/25/2017   Procedure: LAPAROSCOPIC incisional HERNIA WITH LYSIS OF ADHESIONS ERAS PATHWAY;  Surgeon: Michael Boston, MD;  Location: [facility];  Service: General;  Laterality: N/A;  BILATERAL TAP BLOCK     Prior to Admission medications   Medication Sig Start Date End  Date Taking? Authorizing Provider  acetaminophen (TYLENOL) 500 MG tablet Take 1,000 mg by mouth every 8 (eight) hours as needed for moderate pain or headache.    [provider]  albuterol (PROVENTIL HFA;VENTOLIN HFA) 108 (90 Base) MCG/ACT inhaler Inhale 1-2 puffs into the lungs every 6 (six) hours as needed for wheezing or shortness of breath. Patient not taking: Reported on 10/12/2017 04/28/17   Mikey College, NP  escitalopram (LEXAPRO) 10 MG tablet Take 1 tablet (10 mg total) by mouth daily. Patient not taking: Reported  on 10/12/2017 05/25/17   Olin Hauser, DO  fluticasone [facility]) 50 MCG/ACT nasal spray Place 2 sprays into both nostrils daily. Use for 4-6 weeks then stop and use seasonally or as needed. Patient not taking: Reported on 10/12/2017 05/25/17   Olin Hauser, DO  gabapentin (NEURONTIN) 300 MG capsule Take 1 capsule (300 mg total) by mouth 3 (three) times daily. 11/28/17   Michael Boston, MD  ibuprofen (ADVIL,MOTRIN) 200 MG tablet Take 600 mg by mouth every 8 (eight) hours as needed for headache or moderate pain.    [provider]  methocarbamol (ROBAXIN) 750 MG tablet Take 1 tablet (750 mg total) by mouth every 8 (eight) hours as needed for muscle spasms. 11/28/17   Michael Boston, MD  naproxen (NAPROSYN) 500 MG tablet Take 1 tablet (500 mg total) by mouth every 12 (twelve) hours as needed for mild pain or moderate pain. 11/25/17   Michael Boston, MD  oxyCODONE (OXY IR/ROXICODONE) 5 MG immediate release tablet Take 1-2 tablets (5-10 mg total) by mouth every 6 (six) hours as needed for moderate pain, severe pain or breakthrough pain. 11/25/17   Michael Boston, MD  tamsulosin (FLOMAX) 0.4 MG CAPS capsule Take 1 capsule (0.4 mg total) by mouth daily. Patient not taking: Reported on 10/12/2017 02/26/17   Olin Hauser, DO  tiotropium (SPIRIVA HANDIHALER) 18 MCG inhalation capsule Place 1 capsule (18 mcg total) into inhaler and inhale daily. Patient not taking: Reported on 10/12/2017 07/12/17   Olin Hauser, DO     Allergies Patient has no known allergies.   Family History  Problem Relation Age of Onset  . Depression Mother   . Heart disease Maternal Uncle   . Heart attack Maternal Grandmother   . Heart attack Maternal Grandfather     Social History Social History   Tobacco Use  . Smoking status: Current Every Day Smoker    Packs/day: 1.00    Years: 30.00    Pack years: 30.00    Types: Cigarettes  . Smokeless tobacco: Never Used  Substance Use Topics   . Alcohol use: No    Comment: rare  . Drug use: Not Currently    Types: Marijuana    Comment: marijuana in past    Review of Systems  Constitutional:   No fever or chills.  ENT:   No sore throat. No rhinorrhea. Cardiovascular:   No chest pain or syncope. Respiratory:   No dyspnea or cough. Gastrointestinal:   Negative for abdominal pain, vomiting and diarrhea.  Musculoskeletal:   Right hand pain as above All other systems reviewed and are negative except as documented above in ROS and HPI.  ____________________________________________   PHYSICAL EXAM:  VITAL SIGNS: ED Triage Vitals [05/14/18 1313]  Enc Vitals Group     BP 129/81     Pulse Rate 98     Resp 18     Temp 98.3 F (36.8 C)     Temp  src      SpO2 96 %     Weight 195 lb (88.5 kg)     Height 5\' 4"  (1.626 m)     Head Circumference      Peak Flow      Pain Score 10     Pain Loc      Pain Edu?      Excl. in [place]?     Vital signs reviewed, nursing assessments reviewed.   Constitutional:   Alert and oriented. Non-toxic appearance. Eyes:   Conjunctivae are normal. EOMI. PERRL. ENT      Head:   Normocephalic and atraumatic.      Nose:   No congestion/rhinnorhea.       Mouth/Throat:   MMM, no pharyngeal erythema. No peritonsillar mass.       Neck:   No meningismus. Full ROM. Hematological/Lymphatic/Immunilogical:   No cervical lymphadenopathy. Cardiovascular:   RRR. Symmetric bilateral radial and DP pulses.  No murmurs. Cap refill less than 2 seconds. Respiratory:   Normal respiratory effort without tachypnea/retractions. Breath sounds are clear and equal bilaterally. No wheezes/rales/rhonchi. Gastrointestinal:   Soft and nontender. Non distended. There is no CVA tenderness.  No rebound, rigidity, or guarding. Musculoskeletal:   Normal range of motion in all extremities.  Erythema and warmth tenderness and swelling over the second and third MTP joints of the right hand.  Inflammatory changes are present only  over the extensor surface.  The palmar/flexor surface of the hand is normal.  Intact tendon function. Point of care ultrasound used at the bedside by me does not show any focal fluid collection to suggest abscess.  There is what appears to be fat tissue deposits, suspicious for sequelae of his prior gout disease.  Attempted aspiration of the localized areas of swelling is negative for fluid or purulence Neurologic:   Normal speech and language.  Motor grossly intact. No acute focal neurologic deficits are appreciated.  Skin:    Skin is warm, dry and intact. No rash noted.  No petechiae, purpura, or bullae.  ____________________________________________    LABS (pertinent positives/negatives) (all labs ordered are listed, but only abnormal results are displayed) Labs Reviewed  COMPREHENSIVE METABOLIC PANEL - Abnormal; Notable for the following components:      Result Value   Glucose, Bld 136 (*)    Calcium 8.6 (*)    All other components within normal limits  CBC WITH DIFFERENTIAL/PLATELET - Abnormal; Notable for the following components:   WBC 20.5 (*)    HCT 38.8 (*)    Neutro Abs 15.7 (*)    Monocytes Absolute 1.3 (*)    Abs Immature Granulocytes 0.17 (*)    All other components within normal limits  LACTIC ACID, PLASMA   ____________________________________________   EKG    ____________________________________________    RADIOLOGY  Dg Hand 2 View Right  Result Date: 05/14/2018 CLINICAL DATA:  Right hand pain, swelling over 2nd and 3rd MTP joints EXAM: RIGHT HAND - 2 VIEW COMPARISON:  None. FINDINGS: Soft tissue swelling along the dorsum of the hand and within the right middle finger. Comminuted fracture at the tip of the right middle finger, age indeterminate. No additional fracture. No subluxation or dislocation. IMPRESSION: Comminuted fracture of the tip of the right middle finger distal phalanx, age indeterminate. If there is remote history of injury, this could be  chronic as the bone fragments appear corticated. No additional fracture. Electronically Signed   By: Rolm Baptise M.D.   On: 05/14/2018 21:13  ____________________________________________   PROCEDURES Procedures  ____________________________________________    CLINICAL IMPRESSION / ASSESSMENT AND PLAN / ED COURSE  Medications ordered in the ED: Medications  metroNIDAZOLE (FLAGYL) IVPB 500 mg (500 mg Intravenous New Bag/Given 05/14/18 2152)  vancomycin (VANCOCIN) 1,250 mg in sodium chloride 0.9 % 250 mL IVPB (has no administration in time range)  ceFEPIme (MAXIPIME) 2 g in sodium chloride 0.9 % 100 mL IVPB (has no administration in time range)  HYDROmorphone (DILAUDID) injection 1 mg (1 mg Intravenous Given 05/14/18 1948)  lidocaine (PF) (XYLOCAINE) 1 % injection 5 mL (5 mLs Intradermal Given 05/14/18 2027)  ceFEPIme (MAXIPIME) 2 g in sodium chloride 0.9 % 100 mL IVPB (0 g Intravenous Stopped 05/14/18 2153)  sodium chloride 0.9 % bolus 1,000 mL (1,000 mLs Intravenous New Bag/Given 05/14/18 2106)    Pertinent labs & imaging results that were available during my care of the patient were reviewed by me and considered in my medical decision making (see chart for details).    Patient presents with cellulitis of the right hand, worsening and failing outpatient treatment.  Vital signs are normal and the patient is not septic but white blood cell count is 20,000.  X-ray of the hand unremarkable.  Doubt osteomyelitis or necrotizing fasciitis.  No evidence of abscess at this time or tenosynovitis.  Broad-spectrum antibiotics initiated.  Clinical Course as of May 14 2299  Sat May 14, 2018  2240 Hand cellulitis discussed with Dr. Roland Rack who agrees with antibiotics for cellulitis and can consult on the patient here at the hospital tomorrow.   [PS]    Clinical Course User Index [PS] Carrie Mew, MD     ----------------------------------------- 11:08 PM on  05/14/2018 -----------------------------------------  Discussed with hospitalist for further management  ____________________________________________   FINAL CLINICAL IMPRESSION(S) / ED DIAGNOSES    Final diagnoses:  Cellulitis of right hand     ED Discharge Orders    None      Portions of this note were generated with dragon dictation software. Dictation errors may occur despite best attempts at proofreading.   Carrie Mew, MD 05/14/18 2308

## 2018-05-14 NOTE — ED Notes (Signed)
Nurse in with dr for I&d of rt hand

## 2018-05-14 NOTE — ED Triage Notes (Signed)
Pt to ER with c/o pain and swelling to right hand starting at third digit and swelling into hand, wrist and arm.  Pt seen at [facility] and started on bactrim.  States seen at PCP yesterday and that swelling and redness are worse.  Small area of blackened skin to proximal joint of third digit.

## 2018-05-14 NOTE — Progress Notes (Signed)
Pharmacy Antibiotic Note  Sean Herring is a 48 y.o. male admitted on 05/14/2018 with cellulitis.  Pharmacy has been consulted for vancomycin and cefepime dosing.  Plan: DW 89kg  Vd 58L kei 0.085 hr-1  t1/2 8 hours  .Vancomycin 1250 mg IV Q 12 hrs. Goal AUC 400-550. Expected AUC: 511 SCr used: 0.8   Height: 5\' 4"  (162.6 cm) Weight: 195 lb (88.5 kg) IBW/kg (Calculated) : 59.2  Temp (24hrs), Avg:98.3 F (36.8 C), Min:98.3 F (36.8 C), Max:98.3 F (36.8 C)  Recent Labs  Lab 05/14/18 1324  WBC 20.5*  CREATININE 0.63  LATICACIDVEN 1.8    Estimated Creatinine Clearance: 115.7 mL/min (by C-G formula based on SCr of 0.63 mg/dL).    No Known Allergies  Antimicrobials this admission: Vancomycin, cefepime  >>    >>   Dose adjustments this admission:   Microbiology results:   Thank you for allowing pharmacy to be a part of this patient's care.  Myesha Stillion S 05/14/2018 10:49 PM

## 2018-05-15 ENCOUNTER — Encounter
Admission: EM | Disposition: A | Discharge status: Home / Self Care | Payer: Self-pay | Source: Home / Self Care | Attending: Internal Medicine

## 2018-05-15 ENCOUNTER — Observation Stay: Payer: 59 | Admitting: Anesthesiology

## 2018-05-15 DIAGNOSIS — L0390 Cellulitis, unspecified: Secondary | ICD-10-CM | POA: Diagnosis present

## 2018-05-15 DIAGNOSIS — S62609A Fracture of unspecified phalanx of unspecified finger, initial encounter for closed fracture: Secondary | ICD-10-CM | POA: Diagnosis present

## 2018-05-15 HISTORY — PX: INCISION AND DRAINAGE: SHX5863

## 2018-05-15 LAB — CBC
HCT: 38.4 % — ABNORMAL LOW (ref 39.0–52.0)
HEMOGLOBIN: 12.8 g/dL — AB (ref 13.0–17.0)
MCH: 30.8 pg (ref 26.0–34.0)
MCHC: 33.3 g/dL (ref 30.0–36.0)
MCV: 92.5 fL (ref 80.0–100.0)
Platelets: 344 10*3/uL (ref 150–400)
RBC: 4.15 MIL/uL — ABNORMAL LOW (ref 4.22–5.81)
RDW: 13.7 % (ref 11.5–15.5)
WBC: 20 10*3/uL — ABNORMAL HIGH (ref 4.0–10.5)
nRBC: 0 % (ref 0.0–0.2)

## 2018-05-15 LAB — BASIC METABOLIC PANEL
Anion gap: 6 (ref 5–15)
BUN: 11 mg/dL (ref 6–20)
CO2: 25 mmol/L (ref 22–32)
Calcium: 8.6 mg/dL — ABNORMAL LOW (ref 8.9–10.3)
Chloride: 108 mmol/L (ref 98–111)
Creatinine, Ser: 0.63 mg/dL (ref 0.61–1.24)
GFR calc Af Amer: >60 mL/min (ref >60)
Glucose, Bld: 112 mg/dL — ABNORMAL HIGH (ref 70–99)
POTASSIUM: 4.3 mmol/L (ref 3.5–5.1)
Sodium: 139 mmol/L (ref 135–145)

## 2018-05-15 LAB — URIC ACID: Uric Acid, Serum: 4 mg/dL (ref 3.7–8.6)

## 2018-05-15 SURGERY — INCISION AND DRAINAGE
Anesthesia: General | Laterality: Right

## 2018-05-15 MED ORDER — ACETAMINOPHEN 500 MG PO TABS
1000.0000 mg | ORAL_TABLET | Freq: Four times a day (QID) | ORAL | Status: AC
  Administered 2018-05-15 – 2018-05-16 (×3): 1000 mg via ORAL
  Filled 2018-05-15 (×3): qty 2

## 2018-05-15 MED ORDER — ONDANSETRON HCL 4 MG PO TABS: 4.0000 mg | ORAL_TABLET | Freq: Four times a day (QID) | ORAL | Status: DC | PRN

## 2018-05-15 MED ORDER — HYDROMORPHONE HCL 1 MG/ML IJ SOLN
0.2500 mg | INTRAMUSCULAR | Status: DC | PRN
  Administered 2018-05-17 – 2018-05-18 (×7): 0.5 mg via INTRAVENOUS
  Filled 2018-05-15 (×7): qty 1

## 2018-05-15 MED ORDER — FENTANYL CITRATE (PF) 100 MCG/2ML IJ SOLN
25.0000 ug | INTRAMUSCULAR | Status: DC | PRN
  Administered 2018-05-15 (×4): 25 ug via INTRAVENOUS

## 2018-05-15 MED ORDER — ONDANSETRON HCL 4 MG/2ML IJ SOLN: 4.0000 mg | Freq: Four times a day (QID) | INTRAMUSCULAR | Status: DC | PRN

## 2018-05-15 MED ORDER — METOCLOPRAMIDE HCL 5 MG/ML IJ SOLN: 5.0000 mg | Freq: Three times a day (TID) | INTRAMUSCULAR | Status: DC | PRN

## 2018-05-15 MED ORDER — ONDANSETRON HCL 4 MG/2ML IJ SOLN
INTRAMUSCULAR | Status: AC
  Filled 2018-05-15: qty 2

## 2018-05-15 MED ORDER — FENTANYL CITRATE (PF) 100 MCG/2ML IJ SOLN
INTRAMUSCULAR | Status: DC | PRN
  Administered 2018-05-15 (×4): 25 ug via INTRAVENOUS
  Administered 2018-05-15 (×3): 50 ug via INTRAVENOUS

## 2018-05-15 MED ORDER — FENTANYL CITRATE (PF) 250 MCG/5ML IJ SOLN
INTRAMUSCULAR | Status: AC
  Filled 2018-05-15: qty 5

## 2018-05-15 MED ORDER — OXYCODONE HCL 5 MG PO TABS
5.0000 mg | ORAL_TABLET | ORAL | Status: DC | PRN
  Administered 2018-05-15 – 2018-05-18 (×14): 10 mg via ORAL
  Filled 2018-05-15 (×14): qty 2

## 2018-05-15 MED ORDER — LIDOCAINE HCL (CARDIAC) PF 100 MG/5ML IV SOSY
PREFILLED_SYRINGE | INTRAVENOUS | Status: DC | PRN
  Administered 2018-05-15: 100 mg via INTRAVENOUS

## 2018-05-15 MED ORDER — FENTANYL CITRATE (PF) 100 MCG/2ML IJ SOLN
INTRAMUSCULAR | Status: AC
  Filled 2018-05-15: qty 2

## 2018-05-15 MED ORDER — MIDAZOLAM HCL 2 MG/2ML IJ SOLN
INTRAMUSCULAR | Status: DC | PRN
  Administered 2018-05-15: 2 mg via INTRAVENOUS

## 2018-05-15 MED ORDER — DIPHENHYDRAMINE HCL 12.5 MG/5ML PO ELIX: 12.5000 mg | ORAL_SOLUTION | ORAL | Status: DC | PRN

## 2018-05-15 MED ORDER — OXYCODONE HCL 5 MG PO TABS
5.0000 mg | ORAL_TABLET | ORAL | Status: DC | PRN
  Administered 2018-05-15 (×2): 5 mg via ORAL
  Filled 2018-05-15 (×2): qty 1

## 2018-05-15 MED ORDER — HYDROMORPHONE HCL 1 MG/ML IJ SOLN
0.5000 mg | INTRAMUSCULAR | Status: AC | PRN
  Administered 2018-05-15 (×4): 0.5 mg via INTRAVENOUS

## 2018-05-15 MED ORDER — ACETAMINOPHEN 325 MG PO TABS: 650.0000 mg | ORAL_TABLET | Freq: Four times a day (QID) | ORAL | Status: DC | PRN

## 2018-05-15 MED ORDER — ONDANSETRON HCL 4 MG/2ML IJ SOLN
INTRAMUSCULAR | Status: DC | PRN
  Administered 2018-05-15: 4 mg via INTRAVENOUS

## 2018-05-15 MED ORDER — MAGNESIUM HYDROXIDE 400 MG/5ML PO SUSP: 30.0000 mL | Freq: Every day | ORAL | Status: DC | PRN

## 2018-05-15 MED ORDER — ACETAMINOPHEN 650 MG RE SUPP: 650.0000 mg | Freq: Four times a day (QID) | RECTAL | Status: DC | PRN

## 2018-05-15 MED ORDER — PROPOFOL 10 MG/ML IV BOLUS
INTRAVENOUS | Status: DC | PRN
  Administered 2018-05-15: 150 mg via INTRAVENOUS

## 2018-05-15 MED ORDER — METOCLOPRAMIDE HCL 10 MG PO TABS: 5.0000 mg | ORAL_TABLET | Freq: Three times a day (TID) | ORAL | Status: DC | PRN

## 2018-05-15 MED ORDER — SODIUM CHLORIDE 0.9 % IV SOLN
INTRAVENOUS | Status: DC
  Administered 2018-05-15: 21:00:00 via INTRAVENOUS

## 2018-05-15 MED ORDER — LACTATED RINGERS IV SOLN
INTRAVENOUS | Status: DC
  Administered 2018-05-15: 17:00:00 via INTRAVENOUS

## 2018-05-15 MED ORDER — ENOXAPARIN SODIUM 40 MG/0.4ML ~~LOC~~ SOLN: 40.0000 mg | SUBCUTANEOUS | Status: DC

## 2018-05-15 MED ORDER — KETOROLAC TROMETHAMINE 15 MG/ML IJ SOLN
15.0000 mg | Freq: Four times a day (QID) | INTRAMUSCULAR | Status: AC
  Administered 2018-05-15 – 2018-05-16 (×2): 15 mg via INTRAVENOUS
  Filled 2018-05-15 (×3): qty 1

## 2018-05-15 MED ORDER — MIDAZOLAM HCL 2 MG/2ML IJ SOLN
INTRAMUSCULAR | Status: AC
  Filled 2018-05-15: qty 2

## 2018-05-15 MED ORDER — HYDROMORPHONE HCL 1 MG/ML IJ SOLN
0.5000 mg | INTRAMUSCULAR | Status: DC | PRN
  Administered 2018-05-15: 0.5 mg via INTRAVENOUS

## 2018-05-15 MED ORDER — ENOXAPARIN SODIUM 40 MG/0.4ML ~~LOC~~ SOLN
40.0000 mg | SUBCUTANEOUS | Status: DC
  Administered 2018-05-16 – 2018-05-18 (×3): 40 mg via SUBCUTANEOUS
  Filled 2018-05-15 (×3): qty 0.4

## 2018-05-15 MED ORDER — HYDROMORPHONE HCL 1 MG/ML IJ SOLN
INTRAMUSCULAR | Status: AC
  Administered 2018-05-15: 0.5 mg via INTRAVENOUS
  Filled 2018-05-15: qty 1

## 2018-05-15 MED ORDER — ACETAMINOPHEN 325 MG PO TABS: 325.0000 mg | ORAL_TABLET | Freq: Four times a day (QID) | ORAL | Status: DC | PRN

## 2018-05-15 MED ORDER — DOCUSATE SODIUM 100 MG PO CAPS
100.0000 mg | ORAL_CAPSULE | Freq: Two times a day (BID) | ORAL | Status: DC
  Administered 2018-05-15 – 2018-05-18 (×6): 100 mg via ORAL
  Filled 2018-05-15 (×6): qty 1

## 2018-05-15 MED ORDER — FLEET ENEMA 7-19 GM/118ML RE ENEM: 1.0000 | ENEMA | Freq: Once | RECTAL | Status: DC | PRN

## 2018-05-15 MED ORDER — BISACODYL 10 MG RE SUPP: 10.0000 mg | Freq: Every day | RECTAL | Status: DC | PRN

## 2018-05-15 MED ORDER — ONDANSETRON HCL 4 MG/2ML IJ SOLN: 4.0000 mg | Freq: Once | INTRAMUSCULAR | Status: DC | PRN

## 2018-05-15 MED ORDER — HYDROMORPHONE HCL 1 MG/ML IJ SOLN
INTRAMUSCULAR | Status: AC
  Filled 2018-05-15: qty 1

## 2018-05-15 MED ORDER — TRAMADOL HCL 50 MG PO TABS: 50.0000 mg | ORAL_TABLET | Freq: Four times a day (QID) | ORAL | Status: DC | PRN

## 2018-05-15 SURGICAL SUPPLY — 43 items
BLADE SURG SZ10 CARB STEEL (BLADE) ×2 IMPLANT
BNDG COHESIVE 2X5 WHT NS (GAUZE/BANDAGES/DRESSINGS) ×1 IMPLANT
BNDG COHESIVE 4X5 TAN STRL (GAUZE/BANDAGES/DRESSINGS) ×2 IMPLANT
BNDG CONFORM 2 STRL LF (GAUZE/BANDAGES/DRESSINGS) ×1 IMPLANT
BNDG ESMARK 4X12 TAN STRL LF (GAUZE/BANDAGES/DRESSINGS) ×2 IMPLANT
CANISTER SUCT 1200ML W/VALVE (MISCELLANEOUS) ×2 IMPLANT
CHLORAPREP W/TINT 26ML (MISCELLANEOUS) ×2 IMPLANT
COVER WAND RF STERILE (DRAPES) ×1 IMPLANT
CUFF TOURN 18 STER (MISCELLANEOUS) ×1 IMPLANT
CUFF TOURN 24 STER (MISCELLANEOUS) IMPLANT
ELECT REM PT RETURN 9FT ADLT (ELECTROSURGICAL) ×2
ELECTRODE REM PT RTRN 9FT ADLT (ELECTROSURGICAL) ×1 IMPLANT
GAUZE SPONGE 4X4 12PLY STRL (GAUZE/BANDAGES/DRESSINGS) ×2 IMPLANT
GLOVE BIO SURGEON STRL SZ8 (GLOVE) ×4 IMPLANT
GLOVE INDICATOR 8.0 STRL GRN (GLOVE) ×2 IMPLANT
GLOVE SURG ORTHO 8.5 STRL (GLOVE) ×1 IMPLANT
GOWN STRL REUS W/ TWL LRG LVL3 (GOWN DISPOSABLE) ×1 IMPLANT
GOWN STRL REUS W/ TWL XL LVL3 (GOWN DISPOSABLE) ×1 IMPLANT
GOWN STRL REUS W/TWL LRG LVL3 (GOWN DISPOSABLE) ×1
GOWN STRL REUS W/TWL XL LVL3 (GOWN DISPOSABLE) ×1
KIT TURNOVER KIT A (KITS) ×2 IMPLANT
LABEL OR SOLS (LABEL) ×2 IMPLANT
NDL SAFETY ECLIPSE 18X1.5 (NEEDLE) ×1 IMPLANT
NEEDLE HYPO 18GX1.5 SHARP (NEEDLE)
NS IRRIG 1000ML POUR BTL (IV SOLUTION) ×2 IMPLANT
PACK EXTREMITY ARMC (MISCELLANEOUS) ×2 IMPLANT
PAD ABD DERMACEA PRESS 5X9 (GAUZE/BANDAGES/DRESSINGS) ×1 IMPLANT
PAD CAST CTTN 4X4 STRL (SOFTGOODS) ×1 IMPLANT
PADDING CAST COTTON 4X4 STRL (SOFTGOODS)
SPLINT CAST 1 STEP 3X12 (MISCELLANEOUS) ×1 IMPLANT
SPONGE LAP 18X18 RF (DISPOSABLE) ×1 IMPLANT
STAPLER SKIN PROX 35W (STAPLE) ×1 IMPLANT
STOCKINETTE BIAS CUT 4 980044 (GAUZE/BANDAGES/DRESSINGS) ×1 IMPLANT
STOCKINETTE IMPERVIOUS 9X36 MD (GAUZE/BANDAGES/DRESSINGS) ×2 IMPLANT
SUT ETHILON 4-0 (SUTURE)
SUT ETHILON 4-0 FS2 18XMFL BLK (SUTURE)
SUT VIC AB 2-0 CT1 36 (SUTURE) ×1 IMPLANT
SUT VIC AB 4-0 SH 27 (SUTURE)
SUT VIC AB 4-0 SH 27XANBCTRL (SUTURE) ×1 IMPLANT
SUT VICRYL+ 3-0 36IN CT-1 (SUTURE) ×1 IMPLANT
SUTURE ETHLN 4-0 FS2 18XMF BLK (SUTURE) ×1 IMPLANT
SWAB DUAL CULTURE TRANS RED ST (MISCELLANEOUS) ×1 IMPLANT
SYR 10ML LL (SYRINGE) ×2 IMPLANT

## 2018-05-15 NOTE — H&P (Signed)
[facility] [place] NAME: Sean Herring    MR#:  [id number]  DATE OF BIRTH:  [birth year]  DATE OF ADMISSION:  05/14/2018  PRIMARY CARE PHYSICIAN: Normajean Baxter, MD (Inactive)   REQUESTING/REFERRING PHYSICIAN: Joni Fears, MD  CHIEF COMPLAINT:   Chief Complaint  Patient presents with  . Hand Pain    HISTORY OF PRESENT ILLNESS:  Sean Herring  is a 48 y.o. male who presents with chief complaint as above.  Patient presents to the ED with a complaint of right hand pain.  He has a cellulitis for which she was taking Bactrim in the outpatient setting, but his cellulitis progressed.  He came to the ED tonight and he has an area which looks like it may be an abscess.  ED physician tried to drain it and only got very small amount of purulent drainage out of it.  He was also found on x-ray imaging to have a fracture of his distal phalanx of his right finger, though the patient states that this is a very old injury, years old.  Hospitalist were called for admission  PAST MEDICAL HISTORY:   Past Medical History:  Diagnosis Date  . Anxiety   . Biceps tendon rupture 04/2011   s/p repair  . Bipolar 1 disorder (HCC)    no current meds x 8 years  . 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 Depression   . Diverticulitis    s/p colon surgery w/ostomy  . GERD (gastroesophageal reflux disease)   . HTN (hypertension)    off all bp meds x 8 years due to weight loss  . Hyperlipidemia   . Nausea last 6 months   due to hernias  . Syncope and collapse 2011 or 2-12 due to bp meds     PAST SURGICAL HISTORY:   Past Surgical History:  Procedure Laterality Date  . APPENDECTOMY  2010  . BICEPS TENDON REPAIR Right 04/2011  . COLECTOMY WITH COLOSTOMY CREATION/HARTMANN PROCEDURE  01/2009   perf diverticulitis.  [facility] Dr Elta Guadeloupe Bird/Dr Geryl Rankins  . COLOSTOMY TAKEDOWN  07/2009   [facility]  . INGUINAL HERNIA  REPAIR Right 11/25/2017   Procedure: LAPAROSCOPIC bilateral inguinal hernia,;  Surgeon: Michael Boston, MD;  Location: [facility];  Service: General;  Laterality: Right;  . INSERTION OF MESH Bilateral 11/25/2017   Procedure: INSERTION OF MESH for bilateral inguinal hernias and incisional hernia X3;  Surgeon: Michael Boston, MD;  Location: [facility];  Service: General;  Laterality: Bilateral;  . ROTATOR CUFF REPAIR Right 2011   Right shoulder 2011, and Left shoulder 2015  . ROTATOR CUFF REPAIR Left 2015  . VENTRAL HERNIA REPAIR N/A 11/25/2017   Procedure: LAPAROSCOPIC incisional HERNIA WITH LYSIS OF ADHESIONS ERAS PATHWAY;  Surgeon: Michael Boston, MD;  Location: [facility];  Service: General;  Laterality: N/A;  BILATERAL TAP BLOCK     SOCIAL HISTORY:   Social History   Tobacco Use  . Smoking status: Current Every Day Smoker    Packs/day: 1.00    Years: 30.00    Pack years: 30.00    Types: Cigarettes  . Smokeless tobacco: Never Used  Substance Use Topics  . Alcohol use: No    Comment: rare     FAMILY HISTORY:   Family History  Problem Relation Age of Onset  . Depression Mother   . Heart disease Maternal Uncle   . Heart attack Maternal Grandmother   .  Heart attack Maternal Grandfather      DRUG ALLERGIES:  No Known Allergies  MEDICATIONS AT HOME:   Prior to Admission medications   Medication Sig Start Date End Date Taking? Authorizing Provider  acetaminophen (TYLENOL) 500 MG tablet Take 1,000 mg by mouth every 8 (eight) hours as needed for moderate pain or headache.   Yes [provider]  naproxen (NAPROSYN) 250 MG tablet Take 250 mg by mouth every 8 (eight) hours as needed. 05/10/18  Yes [provider]  sulfamethoxazole-trimethoprim (BACTRIM DS,SEPTRA DS) 800-160 MG tablet Take 1 tablet by mouth 2 (two) times daily. 05/12/18  Yes [provider]  ibuprofen (ADVIL,MOTRIN) 200 MG tablet Take 600 mg by mouth every 8 (eight) hours as needed for headache or moderate  pain.    [provider]    REVIEW OF SYSTEMS:  Review of Systems  Constitutional: Negative for chills, fever, malaise/fatigue and weight loss.  HENT: Negative for ear pain, hearing loss and tinnitus.   Eyes: Negative for blurred vision, double vision, pain and redness.  Respiratory: Negative for cough, hemoptysis and shortness of breath.   Cardiovascular: Negative for chest pain, palpitations, orthopnea and leg swelling.  Gastrointestinal: Negative for abdominal pain, constipation, diarrhea, nausea and vomiting.  Genitourinary: Negative for dysuria, frequency and hematuria.  Musculoskeletal: Negative for back pain, joint pain and neck pain.       Right hand pain  Skin:       No acne, rash, or lesions  Neurological: Negative for dizziness, tremors, focal weakness and weakness.  Endo/Heme/Allergies: Negative for polydipsia. Does not bruise/bleed easily.  Psychiatric/Behavioral: Negative for depression. The patient is not nervous/anxious and does not have insomnia.      VITAL SIGNS:   Vitals:   05/14/18 1313  BP: 129/81  Pulse: 98  Resp: 18  Temp: 98.3 F (36.8 C)  SpO2: 96%  Weight: 88.5 kg  Height: 5\' 4"  (1.626 m)   Wt Readings from Last 3 Encounters:  05/14/18 88.5 kg  11/25/17 82.7 kg  10/12/17 85.5 kg    PHYSICAL EXAMINATION:  Physical Exam  Vitals reviewed. Constitutional: He is oriented to person, place, and time. He appears well-developed and well-nourished. No distress.  HENT:  Head: Normocephalic and atraumatic.  Mouth/Throat: Oropharynx is clear and moist.  Eyes: Pupils are equal, round, and reactive to light. Conjunctivae and EOM are normal. No scleral icterus.  Neck: Normal range of motion. Neck supple. No JVD present. No thyromegaly present.  Cardiovascular: Normal rate, regular rhythm and intact distal pulses. Exam reveals no gallop and no friction rub.  No murmur heard. Respiratory: Effort normal and breath sounds normal. No respiratory  distress. He has no wheezes. He has no rales.  GI: Soft. Bowel sounds are normal. He exhibits no distension. There is no abdominal tenderness.  Musculoskeletal: Normal range of motion.        General: No edema.     Comments: No arthritis, no gout  Lymphadenopathy:    He has no cervical adenopathy.  Neurological: He is alert and oriented to person, place, and time. No cranial nerve deficit.  No dysarthria, no aphasia  Skin: Skin is warm and dry. No rash noted. There is erythema (Right hand, with swelling and tenderness).  Psychiatric: He has a normal mood and affect. His behavior is normal. Judgment and thought content normal.    LABORATORY PANEL:   CBC Recent Labs  Lab 05/14/18 1324  WBC 20.5*  HGB 13.1  HCT 38.8*  PLT 325   ------------------------------------------------------------------------------------------------------------------  Chemistries  Recent Labs  Lab 05/14/18 1324  NA 136  K 3.8  CL 105  CO2 23  GLUCOSE 136*  BUN 11  CREATININE 0.63  CALCIUM 8.6*  AST 20  ALT 32  ALKPHOS 86  BILITOT 0.4   ------------------------------------------------------------------------------------------------------------------  Cardiac Enzymes No results for input(s): TROPONINI in the last 168 hours. ------------------------------------------------------------------------------------------------------------------  RADIOLOGY:  Dg Hand 2 View Right  Result Date: 05/14/2018 CLINICAL DATA:  Right hand pain, swelling over 2nd and 3rd MTP joints EXAM: RIGHT HAND - 2 VIEW COMPARISON:  None. FINDINGS: Soft tissue swelling along the dorsum of the hand and within the right middle finger. Comminuted fracture at the tip of the right middle finger, age indeterminate. No additional fracture. No subluxation or dislocation. IMPRESSION: Comminuted fracture of the tip of the right middle finger distal phalanx, age indeterminate. If there is remote history of injury, this could be chronic as  the bone fragments appear corticated. No additional fracture. Electronically Signed   By: Rolm Baptise M.D.   On: 05/14/2018 21:13    EKG:   Orders placed or performed in visit on 11/05/11  . EKG 12-Lead  . Exercise Tolerance Test    IMPRESSION AND PLAN:  Principal Problem:   Cellulitis -patient failed outpatient antibiotic trial, will need IV antibiotics inpatient.  Will have orthopedic surgery evaluate his finger fracture as below, and also if there is any abscess needing drainage, consult placed Active Problems:   Finger fracture, right -orthopedic surgery consult as above   Hypertension -home dose antihypertensives   Hyperlipidemia -home dose antilipid  Chart review performed and case discussed with ED provider. Labs, imaging and/or ECG reviewed by provider and discussed with patient/family. Management plans discussed with the patient and/or family.  DVT PROPHYLAXIS: SubQ lovenox   GI PROPHYLAXIS:  None  ADMISSION STATUS: Observation  CODE STATUS: Full Code Status History    Date Active Date Inactive Code Status Order ID Comments User Context   11/25/2017 1605 11/28/2017 1439 Full Code [id number]  Michael Boston, MD Inpatient      TOTAL TIME TAKING CARE OF THIS PATIENT: 40 minutes.   Ethlyn Daniels 05/15/2018, 12:59 AM  [facility]  Office  [phone]  CC: Primary care physician; Normajean Baxter, MD (Inactive)  Note:  This document was prepared using Dragon voice recognition software and may include unintentional dictation errors.

## 2018-05-15 NOTE — H&P (Signed)
Paper H&P to be scanned into permanent record. H&P reviewed and patient re-examined. No changes. 

## 2018-05-15 NOTE — Progress Notes (Signed)
Pt. Restless at times, reports pain in his hand, attempting to obtain blood pressure also.  Dr. Ronelle Nigh aware of bp, no further orders for blood pressure, may give pain Medication as ordered.  Patient has underlying hypertension that is untreated at this time.

## 2018-05-15 NOTE — Anesthesia Postprocedure Evaluation (Signed)
Anesthesia Post Note  Patient: Sean Herring  Procedure(s) Performed: INCISION AND DRAINAGE (Right )  Patient location during evaluation: PACU Anesthesia Type: General Level of consciousness: awake and alert Pain management: pain level controlled Vital Signs Assessment: post-procedure vital signs reviewed and stable Respiratory status: spontaneous breathing and respiratory function stable Cardiovascular status: stable Anesthetic complications: no     Last Vitals:  Vitals:   05/15/18 1958 05/15/18 1959  BP: (!) 134/114 (!) 152/93  Pulse: 80 77  Resp:    Temp: 36.6 C   SpO2: 100% 99%    Last Pain:  Vitals:   05/15/18 1958  TempSrc: Oral  PainSc:                  Derran Sear K

## 2018-05-15 NOTE — Progress Notes (Signed)
Pt is independent and wife is at bedside.  Pt will let us know when he wants to wash.

## 2018-05-15 NOTE — Transfer of Care (Signed)
Immediate Anesthesia Transfer of Care Note  Patient: Sean Herring  Procedure(s) Performed: INCISION AND DRAINAGE (Right )  Patient Location: PACU  Anesthesia Type:General  Level of Consciousness: drowsy  Airway & Oxygen Therapy: Patient Spontanous Breathing and Patient connected to nasal cannula oxygen  Post-op Assessment: Report given to RN and Post -op Vital signs reviewed and stable  Post vital signs: Reviewed and stable  Last Vitals:  Vitals Value Taken Time  BP 163/103 05/15/2018  6:48 PM  Temp    Pulse 85 05/15/2018  6:50 PM  Resp 15 05/15/2018  6:50 PM  SpO2 96 % 05/15/2018  6:50 PM  Vitals shown include unvalidated device data.  Last Pain:  Vitals:   05/15/18 1241  TempSrc:   PainSc: Asleep         Complications: No apparent anesthesia complications

## 2018-05-15 NOTE — Anesthesia Procedure Notes (Signed)
Procedure Name: LMA Insertion Date/Time: 05/15/2018 5:50 PM Performed by: Clinton Sawyer, CRNA Pre-anesthesia Checklist: Patient identified, Emergency Drugs available, Suction available, Patient being monitored and Timeout performed Patient Re-evaluated:Patient Re-evaluated prior to induction Oxygen Delivery Method: Circle system utilized Preoxygenation: Pre-oxygenation with 100% oxygen Induction Type: IV induction LMA: LMA inserted LMA Size: 5.0 Number of attempts: 1 Dental Injury: Teeth and Oropharynx as per pre-operative assessment

## 2018-05-15 NOTE — Consult Note (Signed)
ORTHOPAEDIC CONSULTATION  REQUESTING PHYSICIAN: Nicholes Mango, MD  Chief Complaint:   Right hand pain and swelling.  History of Present Illness: Sean Herring is a 48 y.o. male with a history of hypertension, hyperlipidemia, bipolar disorder, and probable gout who presents with a 4-day history of progressively worsening pain, swelling, and erythema over the dorsal aspect of his hand, centered over the index and long MCP joints.  Initially, he went to an urgent care facility where he was started on Bactrim without substantial benefit.  Therefore, the patient saw his primary care provider yesterday afternoon and was referred to the emergency room where he was admitted, placed on IV antibiotics, and orthopedic consultation requested.  The patient denies any specific injury to the area, but does work with his hands a lot.  The patient denies any fevers or chills, and denies any numbness or paresthesias to his hand.  Past Medical History:  Diagnosis Date  . Anxiety   . Biceps tendon rupture 04/2011   s/p repair  . Bipolar 1 disorder (HCC)    no current meds x 8 years  . 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 Depression   . Diverticulitis    s/p colon surgery w/ostomy  . GERD (gastroesophageal reflux disease)   . HTN (hypertension)    off all bp meds x 8 years due to weight loss  . Hyperlipidemia   . Nausea last 6 months   due to hernias  . Syncope and collapse 2011 or 2-12 due to bp meds   Past Surgical History:  Procedure Laterality Date  . APPENDECTOMY  2010  . BICEPS TENDON REPAIR Right 04/2011  . COLECTOMY WITH COLOSTOMY CREATION/HARTMANN PROCEDURE  01/2009   perf diverticulitis.  [facility] Dr Elta Guadeloupe Bird/Dr Geryl Rankins  . COLOSTOMY TAKEDOWN  07/2009   [facility]  . INGUINAL HERNIA REPAIR Right 11/25/2017   Procedure: LAPAROSCOPIC bilateral inguinal hernia,;  Surgeon: Michael Boston, MD;  Location: [facility];  Service: General;  Laterality: Right;  . INSERTION OF MESH Bilateral 11/25/2017   Procedure: INSERTION OF MESH for bilateral inguinal hernias and incisional hernia X3;  Surgeon: Michael Boston, MD;  Location: [facility];  Service: General;  Laterality: Bilateral;  . ROTATOR CUFF REPAIR Right 2011   Right shoulder 2011, and Left shoulder 2015  . ROTATOR CUFF REPAIR Left 2015  . VENTRAL HERNIA REPAIR N/A 11/25/2017   Procedure: LAPAROSCOPIC incisional HERNIA WITH LYSIS OF ADHESIONS ERAS PATHWAY;  Surgeon: Michael Boston, MD;  Location: [facility];  Service: General;  Laterality: N/A;  BILATERAL TAP BLOCK   Social History   Socioeconomic History  . Marital status: Married    Spouse name: Not on file  . Number of children: 3  . Years of education: Not on file  . Highest education level: Not on file  Occupational History  . Occupation: Personal assistant: [employer]  Social Needs  . Financial resource strain: Not on file  . Food insecurity:    Worry: Not on file    Inability: Not on file  . Transportation needs:    Medical: Not on file    Non-medical: Not on file  Tobacco Use  . Smoking status: Current Every Day Smoker    Packs/day: 1.00    Years: 30.00    Pack years: 30.00    Types: Cigarettes  . Smokeless tobacco: Never Used  Substance and Sexual Activity  . Alcohol use: No  Comment: rare  . Drug use: Not Currently    Types: Marijuana    Comment: marijuana in past  . Sexual activity: Not on file  Lifestyle  . Physical activity:    Days per week: Not on file    Minutes per session: Not on file  . Stress: Not on file  Relationships  . Social connections:    Talks on phone: Not on file    Gets together: Not on file    Attends religious service: Not on file    Active member of club or organization: Not on file    Attends meetings of clubs or organizations: Not on file    Relationship status: Not on file  Other Topics Concern  . Not  on file  Social History Narrative   Lives in [place] with wife, 3 children 21YO,18YO,15YO. Works - Building control surveyor.   Family History  Problem Relation Age of Onset  . Depression Mother   . Heart disease Maternal Uncle   . Heart attack Maternal Grandmother   . Heart attack Maternal Grandfather    No Known Allergies Prior to Admission medications   Medication Sig Start Date End Date Taking? Authorizing Provider  acetaminophen (TYLENOL) 500 MG tablet Take 1,000 mg by mouth every 8 (eight) hours as needed for moderate pain or headache.   Yes [provider]  naproxen (NAPROSYN) 250 MG tablet Take 250 mg by mouth every 8 (eight) hours as needed. 05/10/18  Yes [provider]  sulfamethoxazole-trimethoprim (BACTRIM DS,SEPTRA DS) 800-160 MG tablet Take 1 tablet by mouth 2 (two) times daily. 05/12/18  Yes [provider]  ibuprofen (ADVIL,MOTRIN) 200 MG tablet Take 600 mg by mouth every 8 (eight) hours as needed for headache or moderate pain.    [provider]   Dg Hand 2 View Right  Result Date: 05/14/2018 CLINICAL DATA:  Right hand pain, swelling over 2nd and 3rd MTP joints EXAM: RIGHT HAND - 2 VIEW COMPARISON:  None. FINDINGS: Soft tissue swelling along the dorsum of the hand and within the right middle finger. Comminuted fracture at the tip of the right middle finger, age indeterminate. No additional fracture. No subluxation or dislocation. IMPRESSION: Comminuted fracture of the tip of the right middle finger distal phalanx, age indeterminate. If there is remote history of injury, this could be chronic as the bone fragments appear corticated. No additional fracture. Electronically Signed   By: Rolm Baptise M.D.   On: 05/14/2018 21:13    Positive ROS: All other systems have been reviewed and were otherwise negative with the exception of those mentioned in the HPI and as above.  Physical Exam: General:  Alert, no acute distress Psychiatric:  Patient is competent for  consent with normal mood and affect   Cardiovascular:  No pedal edema Respiratory:  No wheezing, non-labored breathing GI:  Abdomen is soft and non-tender Skin:  No lesions in the area of chief complaint Neurologic:  Sensation intact distally Lymphatic:  No axillary or cervical lymphadenopathy  Orthopedic Exam:  Orthopedic examination is limited to the right hand.  There is an area of swelling and ecchymosis over the dorsal aspect of the right hand centered over the long MCP joint but extending toward the index MCP joint, measuring approximately 3 to 4 cm in diameter with a central necrotic area.  Lesser swelling and erythema extends proximally along the dorsal aspect of his hand to his wrist.  He has moderate tenderness to palpation in this area.  He is able  to active flex and extend all digits, although motion at the MCP joint of the long finger is moderately painful.  He has little if any pain with motion of the index or ring MCP joint nor of any of the PIP or DIP joints.  There is no pain or redness along the flexor tendons of any of his digits.  He is neurovascularly intact all digits.  X-rays:  AP and lateral x-rays of the right hand are available for review and have been reviewed by myself.  These films demonstrate no evidence of fractures, lytic lesions, or significant degenerative changes.  There is an old tuft fracture at the tip of the long finger.  No lytic lesions or any acute fractures are identified.  Assessment: Right dorsal hand cellulitis with probable sepsis of MCP joint, right long finger.  Plan: The treatment options have been discussed with the patient, including both surgical and nonsurgical choices.  The patient would like to proceed with surgical intervention to include a formal irrigation and debridement of the area of infection over the dorsal aspect of the right hand with exploration and irrigation of the long MCP joint.  This procedure has been discussed in detail with  the patient, as have the potential risks (including bleeding, infection, nerve and/or blood vessel injury, persistent recurrent pain, stiffness of the finger, need for further surgery, blood clots, strokes, heart attacks and arrhythmias, etc.) and benefits.  The patient states his understanding wishes to proceed.  A formal written consent will be obtained by the nursing staff.  Thank you for asking me to participate in the care of this most unfortunate man.  I will be happy to follow him with you.   Pascal Lux, MD  Beeper #:  [phone]  05/15/2018 11:44 AM

## 2018-05-15 NOTE — Progress Notes (Signed)
[facility] [place] NAME: Sean Herring    MR#:  [id number]  DATE OF BIRTH:  05/04/1970  SUBJECTIVE:  CHIEF COMPLAINT:   Patient is reporting swelling and pain in the right middle finger  REVIEW OF SYSTEMS:  CONSTITUTIONAL: No fever, fatigue or weakness.  EYES: No blurred or double vision.  EARS, NOSE, AND THROAT: No tinnitus or ear pain.  RESPIRATORY: No cough, shortness of breath, wheezing or hemoptysis.  CARDIOVASCULAR: No chest pain, orthopnea, edema.  GASTROINTESTINAL: No nausea, vomiting, diarrhea or abdominal pain.  GENITOURINARY: No dysuria, hematuria.  ENDOCRINE: No polyuria, nocturia,  HEMATOLOGY: No anemia, easy bruising or bleeding SKIN: No rash MUSCULOSKELETAL: Right middle finger with swelling and pain NEUROLOGIC: No tingling, numbness, weakness.  PSYCHIATRY: No anxiety or depression.   DRUG ALLERGIES:  No Known Allergies  VITALS:  Blood pressure 118/67, pulse 76, temperature 98.2 F (36.8 C), temperature source Oral, resp. rate 18, height 5\' 4"  (1.626 m), weight 88.5 kg, SpO2 99 %.  PHYSICAL EXAMINATION:  GENERAL:  48 y.o.-year-old patient lying in the bed with no acute distress.  EYES: Pupils equal, round, reactive to light and accommodation. No scleral icterus. Extraocular muscles intact.  HEENT: Head atraumatic, normocephalic. Oropharynx and nasopharynx clear.  NECK:  Supple, no jugular venous distention. No thyroid enlargement, no tenderness.  LUNGS: Normal breath sounds bilaterally, no wheezing, rales,rhonchi or crepitation. No use of accessory muscles of respiration.  CARDIOVASCULAR: S1, S2 normal. No murmurs, rubs, or gallops.  ABDOMEN: Soft, nontender, nondistended. Bowel sounds present EXTREMITIES: Dorsum of the right hand, right middle finger edematous, extremely tender and erythematous no pedal edema, cyanosis, or clubbing.  NEUROLOGIC: Awake, alert and oriented x3 sensation intact. Gait not  checked.  PSYCHIATRIC: The patient is alert and oriented x 3.  SKIN: No obvious rash, lesion, or ulcer.    LABORATORY PANEL:   CBC Recent Labs  Lab 05/15/18 0557  WBC 20.0*  HGB 12.8*  HCT 38.4*  PLT 344   ------------------------------------------------------------------------------------------------------------------  Chemistries  Recent Labs  Lab 05/14/18 1324 05/15/18 0557  NA 136 139  K 3.8 4.3  CL 105 108  CO2 23 25  GLUCOSE 136* 112*  BUN 11 11  CREATININE 0.63 0.63  CALCIUM 8.6* 8.6*  AST 20  --   ALT 32  --   ALKPHOS 86  --   BILITOT 0.4  --    ------------------------------------------------------------------------------------------------------------------  Cardiac Enzymes No results for input(s): TROPONINI in the last 168 hours. ------------------------------------------------------------------------------------------------------------------  RADIOLOGY:  Dg Hand 2 View Right  Result Date: 05/14/2018 CLINICAL DATA:  Right hand pain, swelling over 2nd and 3rd MTP joints EXAM: RIGHT HAND - 2 VIEW COMPARISON:  None. FINDINGS: Soft tissue swelling along the dorsum of the hand and within the right middle finger. Comminuted fracture at the tip of the right middle finger, age indeterminate. No additional fracture. No subluxation or dislocation. IMPRESSION: Comminuted fracture of the tip of the right middle finger distal phalanx, age indeterminate. If there is remote history of injury, this could be chronic as the bone fragments appear corticated. No additional fracture. Electronically Signed   By: Rolm Baptise M.D.   On: 05/14/2018 21:13    EKG:   Orders placed or performed in visit on 11/05/11  . EKG 12-Lead  . Exercise Tolerance Test    ASSESSMENT AND PLAN:     Right dorsal hand cellulitis with possible sepsis of the MCP joint of the right middle finger- patient  failed outpatient antibiotic trial, will need IV antibiotics cefepime and vancomycin  inpatient.   Pain management as needed Patient is agreeable for formal irrigation and debridement of the long MCP joint Appreciate orthopedics recommendations   - comminuted right middle finger fracture -orthopedic surgery consult as above    -Gout check uric acid level  -Hypertension not on any medications stable at this time monitor blood pressure   -Hyperlipidemia check fasting lipid panel not on home medications     All the records are reviewed and case discussed with Care Management/Social Workerr. Management plans discussed with the patient, family and they are in agreement.  CODE STATUS: fc  TOTAL TIME TAKING CARE OF THIS PATIENT: 36 minutes.   POSSIBLE D/C IN 2 DAYS, DEPENDING ON CLINICAL CONDITION.  Note: This dictation was prepared with Dragon dictation along with smaller phrase technology. Any transcriptional errors that result from this process are unintentional.   Nicholes Mango M.D on 05/15/2018 at 3:23 PM  Between 7am to 6pm - Pager - [phone] After 6pm go to [email or web address] - password [email or web address] [facility]  Office  [phone]  CC: Primary care physician; Normajean Baxter, MD (Inactive)

## 2018-05-15 NOTE — Plan of Care (Signed)

## 2018-05-15 NOTE — Anesthesia Post-op Follow-up Note (Signed)
Anesthesia QCDR form completed.        

## 2018-05-15 NOTE — Progress Notes (Signed)
Right finger pink in color, warm to touch, wnl.  Patient's right hand elevated on 2 pillows.

## 2018-05-15 NOTE — Op Note (Signed)
05/15/2018  6:40 PM  Patient:   Sean Herring  Pre-Op Diagnosis:   Dorsal right hand cellulitis with probable septic arthritis, right long MCP joint.  Post-Op Diagnosis:   Dorsal right hand cellulitis with subcutaneous abscess, right long finger.  Procedure:   Irrigation and debridement of dorsal right hand abscess.  Surgeon:   Pascal Lux, MD  Assistant:   None  Anesthesia:   General LMA  Findings:   As above.  Complications:   None  Fluids:   300 cc crystalloid  EBL:   25 cc  UOP:   None  TT:   17 minutes at 250 mmHg  Drains:   None  Closure:   None  Brief Clinical Note:   The patient is a 48 year old male with a 4-day history of progressively worsening pain, erythema, and swelling of his right hand.  His symptoms have progressed despite met oral antibiotics.  He presented to the emergency room last evening and was admitted for IV antibiotics and orthopedic consultation.  His history and examination were consistent with a significant cutaneous soft tissue infection of the dorsal aspect of the right hand over the right long MCP joint with possible MCP joint involvement.  The patient presents at this time for exploration and irrigation and debridement of the dorsal right hand abscess.  Procedure:   The patient was brought into the operating room and lain in the supine position.  After adequate general laryngeal mask anesthesia was obtained, the patient's right hand was prepped with ChloraPrep solution before being draped sterilely.  Preoperative antibiotics were administered.  A timeout was performed to verify the appropriate surgical site.  The hand was held in an elevated position for several minutes before the tourniquet was inflated to 250 mmHg.  The area of blistering over the dorsal aspect of the MCP joint of the long finger was debrided first.  There was necrotic dermal tissue beneath this which also was debrided, exposing the subcutaneous tissues beneath.  Areas  of purulent material were identified and debrided.  These areas extended to both the radial and ulnar aspects of the proximal phalanx as well as to the radial and and ulnar aspects of the distal portion of the long metacarpal, but did not appear to involve the joint.  These areas were debrided thoroughly using curettes, a rongeur, and a #15 blade back to viable margins.  In total, there was an area of approximately 2.5 x 3.5 cm of dermal tissue removed dorsally.  The tourniquet was let down and pressure held on the wound for about 5 minutes to help with hemostasis before the wound was irrigated thoroughly with sterile saline solution.  A sterile bulky dressing was applied to the wound before the patient was awakened, extubated, and returned to the recovery room in satisfactory condition after tolerating the procedure well.

## 2018-05-15 NOTE — Progress Notes (Signed)
Contacted DR. Kephart in reference to pain, may give dilaudid now for pain.

## 2018-05-15 NOTE — Anesthesia Preprocedure Evaluation (Signed)
Anesthesia Evaluation  Patient identified by MRN, date of birth, ID band Patient awake    Reviewed: Allergy & Precautions, NPO status , Patient's Chart, lab work & pertinent test results  History of Anesthesia Complications Negative for: history of anesthetic complications  Airway Mallampati: III       Dental   Pulmonary neg sleep apnea, COPD (not using any meds), Current Smoker,           Cardiovascular hypertension (not using meds), (-) Past MI and (-) CHF (-) dysrhythmias (-) Valvular Problems/Murmurs     Neuro/Psych neg Seizures Anxiety Depression Bipolar Disorder    GI/Hepatic Neg liver ROS, neg GERD  ,  Endo/Other  neg diabetes  Renal/GU negative Renal ROS     Musculoskeletal   Abdominal   Peds  Hematology   Anesthesia Other Findings   Reproductive/Obstetrics                             Anesthesia Physical Anesthesia Plan  ASA: III and emergent  Anesthesia Plan: General   Post-op Pain Management:    Induction: Intravenous  PONV Risk Score and Plan: 1 and Ondansetron  Airway Management Planned: LMA  Additional Equipment:   Intra-op Plan:   Post-operative Plan:   Informed Consent: I have reviewed the patients History and Physical, chart, labs and discussed the procedure including the risks, benefits and alternatives for the proposed anesthesia with the patient or authorized representative who has indicated his/her understanding and acceptance.       Plan Discussed with:   Anesthesia Plan Comments:         Anesthesia Quick Evaluation

## 2018-05-16 ENCOUNTER — Encounter: Payer: Self-pay | Admitting: Surgery

## 2018-05-16 ENCOUNTER — Ambulatory Visit: Payer: Self-pay | Admitting: Surgery

## 2018-05-16 DIAGNOSIS — Z79899 Other long term (current) drug therapy: Secondary | ICD-10-CM | POA: Diagnosis not present

## 2018-05-16 DIAGNOSIS — L03113 Cellulitis of right upper limb: Secondary | ICD-10-CM | POA: Diagnosis present

## 2018-05-16 DIAGNOSIS — Z818 Family history of other mental and behavioral disorders: Secondary | ICD-10-CM | POA: Diagnosis not present

## 2018-05-16 DIAGNOSIS — Z791 Long term (current) use of non-steroidal anti-inflammatories (NSAID): Secondary | ICD-10-CM | POA: Diagnosis not present

## 2018-05-16 DIAGNOSIS — E785 Hyperlipidemia, unspecified: Secondary | ICD-10-CM | POA: Diagnosis present

## 2018-05-16 DIAGNOSIS — K219 Gastro-esophageal reflux disease without esophagitis: Secondary | ICD-10-CM | POA: Diagnosis present

## 2018-05-16 DIAGNOSIS — Z9049 Acquired absence of other specified parts of digestive tract: Secondary | ICD-10-CM | POA: Diagnosis not present

## 2018-05-16 DIAGNOSIS — I10 Essential (primary) hypertension: Secondary | ICD-10-CM | POA: Diagnosis present

## 2018-05-16 DIAGNOSIS — F17210 Nicotine dependence, cigarettes, uncomplicated: Secondary | ICD-10-CM | POA: Diagnosis present

## 2018-05-16 DIAGNOSIS — A419 Sepsis, unspecified organism: Secondary | ICD-10-CM | POA: Diagnosis present

## 2018-05-16 DIAGNOSIS — L02511 Cutaneous abscess of right hand: Secondary | ICD-10-CM | POA: Diagnosis present

## 2018-05-16 DIAGNOSIS — M79641 Pain in right hand: Secondary | ICD-10-CM | POA: Diagnosis present

## 2018-05-16 DIAGNOSIS — Z8249 Family history of ischemic heart disease and other diseases of the circulatory system: Secondary | ICD-10-CM | POA: Diagnosis not present

## 2018-05-16 DIAGNOSIS — M009 Pyogenic arthritis, unspecified: Secondary | ICD-10-CM | POA: Diagnosis present

## 2018-05-16 DIAGNOSIS — M109 Gout, unspecified: Secondary | ICD-10-CM | POA: Diagnosis present

## 2018-05-16 DIAGNOSIS — S62602A Fracture of unspecified phalanx of right middle finger, initial encounter for closed fracture: Secondary | ICD-10-CM | POA: Diagnosis present

## 2018-05-16 LAB — BASIC METABOLIC PANEL
ANION GAP: 8 (ref 5–15)
BUN: 12 mg/dL (ref 6–20)
CO2: 25 mmol/L (ref 22–32)
Calcium: 8.7 mg/dL — ABNORMAL LOW (ref 8.9–10.3)
Chloride: 106 mmol/L (ref 98–111)
Creatinine, Ser: 0.47 mg/dL — ABNORMAL LOW (ref 0.61–1.24)
GFR calc Af Amer: >60 mL/min (ref >60)
GFR calc non Af Amer: >60 mL/min (ref >60)
GLUCOSE: 103 mg/dL — AB (ref 70–99)
Potassium: 3.9 mmol/L (ref 3.5–5.1)
Sodium: 139 mmol/L (ref 135–145)

## 2018-05-16 LAB — CBC
HCT: 38.2 % — ABNORMAL LOW (ref 39.0–52.0)
Hemoglobin: 12.6 g/dL — ABNORMAL LOW (ref 13.0–17.0)
MCH: 30.7 pg (ref 26.0–34.0)
MCHC: 33 g/dL (ref 30.0–36.0)
MCV: 93.2 fL (ref 80.0–100.0)
Platelets: 337 10*3/uL (ref 150–400)
RBC: 4.1 MIL/uL — AB (ref 4.22–5.81)
RDW: 13.5 % (ref 11.5–15.5)
WBC: 18.9 10*3/uL — ABNORMAL HIGH (ref 4.0–10.5)
nRBC: 0 % (ref 0.0–0.2)

## 2018-05-16 LAB — LIPID PANEL
CHOL/HDL RATIO: 5 ratio
Cholesterol: 139 mg/dL (ref 0–200)
HDL: 28 mg/dL — AB (ref >40)
LDL Cholesterol: 75 mg/dL (ref 0–99)
Triglycerides: 182 mg/dL — ABNORMAL HIGH (ref <150)
VLDL: 36 mg/dL (ref 0–40)

## 2018-05-16 NOTE — Progress Notes (Signed)
PT Cancellation Note  Patient Details Name: Sean Herring MRN: [id number] DOB: Mar 31, 1970   Cancelled Treatment:    Reason Eval/Treat Not Completed: PT screened, no needs identified, will sign off(Consult received and chart reviewed.  Per both patient and RN report, patient mobilizing indep in room without difficulty; feels at baseline with no safety concerns identified (wife verifies as well).  Will complete PT order at this time; OT order placed to address R UE and self-care needs.)  Guinn Delarosa H. Owens Shark, PT, DPT, NCS 05/16/18, 11:16 AM [phone]

## 2018-05-16 NOTE — Progress Notes (Signed)
[facility] NAME: Sean Herring    MR#:  [id number]  DATE OF BIRTH:  Jul 06, 1970  SUBJECTIVE:  CHIEF COMPLAINT:   Patient is reporting swelling and pain in the right middle finger are better.  Declined OT evaluation  REVIEW OF SYSTEMS:  CONSTITUTIONAL: No fever, fatigue or weakness.  EYES: No blurred or double vision.  EARS, NOSE, AND THROAT: No tinnitus or ear pain.  RESPIRATORY: No cough, shortness of breath, wheezing or hemoptysis.  CARDIOVASCULAR: No chest pain, orthopnea, edema.  GASTROINTESTINAL: No nausea, vomiting, diarrhea or abdominal pain.  GENITOURINARY: No dysuria, hematuria.  ENDOCRINE: No polyuria, nocturia,  HEMATOLOGY: No anemia, easy bruising or bleeding SKIN: No rash MUSCULOSKELETAL: Right middle finger with swelling and pain NEUROLOGIC: No tingling, numbness, weakness.  PSYCHIATRY: No anxiety or depression.   DRUG ALLERGIES:  No Known Allergies  VITALS:  Blood pressure 124/83, pulse 74, temperature 97.8 F (36.6 C), resp. rate 18, height 5\' 4"  (1.626 m), weight 88.5 kg, SpO2 98 %.  PHYSICAL EXAMINATION:  GENERAL:  48 y.o.-year-old patient lying in the bed with no acute distress.  EYES: Pupils equal, round, reactive to light and accommodation. No scleral icterus. Extraocular muscles intact.  HEENT: Head atraumatic, normocephalic. Oropharynx and nasopharynx clear.  NECK:  Supple, no jugular venous distention. No thyroid enlargement, no tenderness.  LUNGS: Normal breath sounds bilaterally, no wheezing, rales,rhonchi or crepitation. No use of accessory muscles of respiration.  CARDIOVASCULAR: S1, S2 normal. No murmurs, rubs, or gallops.  ABDOMEN: Soft, nontender, nondistended. Bowel sounds present EXTREMITIES: Dorsum of the right hand, right middle finger at the dressing  nEUROLOGIC: Awake, alert and oriented x3 sensation intact. Gait not checked.  PSYCHIATRIC: The patient is alert and oriented x 3.   SKIN: No obvious rash, lesion, or ulcer.    LABORATORY PANEL:   CBC Recent Labs  Lab 05/16/18 0438  WBC 18.9*  HGB 12.6*  HCT 38.2*  PLT 337   ------------------------------------------------------------------------------------------------------------------  Chemistries  Recent Labs  Lab 05/14/18 1324  05/16/18 0438  NA 136   < > 139  K 3.8   < > 3.9  CL 105   < > 106  CO2 23   < > 25  GLUCOSE 136*   < > 103*  BUN 11   < > 12  CREATININE 0.63   < > 0.47*  CALCIUM 8.6*   < > 8.7*  AST 20  --   --   ALT 32  --   --   ALKPHOS 86  --   --   BILITOT 0.4  --   --    < > = values in this interval not displayed.   ------------------------------------------------------------------------------------------------------------------  Cardiac Enzymes No results for input(s): TROPONINI in the last 168 hours. ------------------------------------------------------------------------------------------------------------------  RADIOLOGY:  Dg Hand 2 View Right  Result Date: 05/14/2018 CLINICAL DATA:  Right hand pain, swelling over 2nd and 3rd MTP joints EXAM: RIGHT HAND - 2 VIEW COMPARISON:  None. FINDINGS: Soft tissue swelling along the dorsum of the hand and within the right middle finger. Comminuted fracture at the tip of the right middle finger, age indeterminate. No additional fracture. No subluxation or dislocation. IMPRESSION: Comminuted fracture of the tip of the right middle finger distal phalanx, age indeterminate. If there is remote history of injury, this could be chronic as the bone fragments appear corticated. No additional fracture. Electronically Signed   By: Rolm Baptise M.D.   On: 05/14/2018  21:13    EKG:   Orders placed or performed in visit on 11/05/11  . EKG 12-Lead  . Exercise Tolerance Test    ASSESSMENT AND PLAN:     Right dorsal hand cellulitis with possible sepsis of the MCP joint of the right middle finger- patient failed outpatient antibiotic trial,  will need IV antibiotics cefepime and vancomycin inpatient.   Pain management as needed Patient is status post formal irrigation and debridement of the long MCP joint-postop day #1 Cultures from 05/15/2018 gram-positive cocci in clusters Appreciate orthopedics recommendations -Leukocytosis trending down   - comminuted right middle finger fracture -orthopedic surgery consult as above    -Gout -normal uric acid level  -Hypertension not on any medications stable at this time monitor blood pressure   -Hyperlipidemia check fasting lipid panel not on home medications     All the records are reviewed and case discussed with Care Management/Social Workerr. Management plans discussed with the patient, family and they are in agreement.  CODE STATUS: fc  TOTAL TIME TAKING CARE OF THIS PATIENT: 36 minutes.   POSSIBLE D/C IN 2 DAYS, DEPENDING ON CLINICAL CONDITION.  Note: This dictation was prepared with Dragon dictation along with smaller phrase technology. Any transcriptional errors that result from this process are unintentional.   Nicholes Mango M.D on 05/16/2018 at 3:32 PM  Between 7am to 6pm - Pager - [phone] After 6pm go to [email or web address] - password [email or web address]  Office  [phone]  CC: Primary care physician; Normajean Baxter, MD (Inactive)

## 2018-05-16 NOTE — Progress Notes (Signed)
OT Cancellation Note  Patient Details Name: Sean Herring MRN: [id number] DOB: 10-08-1970   Cancelled Evaluation:    Reason Eval/Treat Not Completed: Patient declined, no reason specified. Order received, chart reviewed. Upon arrival to pt's room, pt in bed with partner at bedside. Therapist explained role of OT. Pt declined evaluation at this time. Pt and partner stated they would like to wait until pt has his bandages removed and sees his doctor, before OT evaluation. Will follow acutely and re-attempt as pt is available and medically appropriate for OT tx.   Shara Blazing, M.S., OTR/L Ascom: [phone] 05/16/18, 1:21 PM

## 2018-05-16 NOTE — Addendum Note (Signed)
Addendum  created 05/16/18 1501 by Doreen Salvage, CRNA   Charge Capture section accepted, Visit diagnoses modified

## 2018-05-16 NOTE — Progress Notes (Signed)
  Subjective: 1 Day Post-Op Procedure(s) (LRB): INCISION AND DRAINAGE (Right) Patient reports pain as mild.   Patient is well, and has had no acute complaints or problems Plan is to go Home after hospital stay. Negative for chest pain and shortness of breath Fever: No recent fevers Gastrointestinal:Negative for nausea and vomiting  Objective: Vital signs in last 24 hours: Temp:  [97.4 F (36.3 C)-98.5 F (36.9 C)] 97.8 F (36.6 C) (02/24 0736) Pulse Rate:  [66-102] 66 (02/24 0736) Resp:  [12-21] 17 (02/24 0736) BP: (113-175)/(64-114) 126/64 (02/24 0736) SpO2:  [94 %-100 %] 99 % (02/24 0736)  Intake/Output from previous day:  Intake/Output Summary (Last 24 hours) at 05/16/2018 0800 Last data filed at 05/16/2018 0500 Gross per 24 hour  Intake 1564.1 ml  Output 25 ml  Net 1539.1 ml    Intake/Output this shift: No intake/output data recorded.  Labs: Recent Labs    05/14/18 1324 05/15/18 0557 05/16/18 0438  HGB 13.1 12.8* 12.6*   Recent Labs    05/15/18 0557 05/16/18 0438  WBC 20.0* 18.9*  RBC 4.15* 4.10*  HCT 38.4* 38.2*  PLT 344 337   Recent Labs    05/15/18 0557 05/16/18 0438  NA 139 139  K 4.3 3.9  CL 108 106  CO2 25 25  BUN 11 12  CREATININE 0.63 0.47*  GLUCOSE 112* 103*  CALCIUM 8.6* 8.7*   No results for input(s): LABPT, INR in the last 72 hours.   EXAM General - Patient is Alert, Appropriate and Oriented Extremity - Dressing intact to the right hand, no evidence of drainage.  ABle to flex and extend fingers with mild pain, able to flex and extend thumb without pain.  Cap refill intact to each finger, intact to light touch. Motor Function - intact, moving foot and toes well on exam.  Past Medical History:  Diagnosis Date  . Anxiety   . Biceps tendon rupture 04/2011   s/p repair  . Bipolar 1 disorder (HCC)    no current meds x 8 years  . 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  Depression   . Diverticulitis    s/p colon surgery w/ostomy  . GERD (gastroesophageal reflux disease)   . HTN (hypertension)    off all bp meds x 8 years due to weight loss  . Hyperlipidemia   . Nausea last 6 months   due to hernias  . Syncope and collapse 2011 or 2-12 due to bp meds    Assessment/Plan: 1 Day Post-Op Procedure(s) (LRB): INCISION AND DRAINAGE (Right) Principal Problem:   Cellulitis Active Problems:   Hypertension   Hyperlipidemia   Bipolar 1 disorder (HCC)   Finger fracture, right  Estimated body mass index is 33.47 kg/m as calculated from the following:   Height as of this encounter: 5\' 4"  (1.626 m).   Weight as of this encounter: 88.5 kg. Advance diet Up with therapy   Labs reviewed this AM, WBC 18.9 this AM.  CBC and BMP ordered for tomorrow. Continue with IV Vancomycin and Cefepime.  Culture is still pending at this time. Will perform dressing change either later this afternoon or tomorrow morning.  DVT Prophylaxis - Lovenox, Foot Pumps and TED hose Minimal weightbearing to the right hand.  Raquel Willis Holquin, PA-C [facility] Orthopaedic Surgery 05/16/2018, 8:00 AM

## 2018-05-17 LAB — CBC
HEMATOCRIT: 40.7 % (ref 39.0–52.0)
Hemoglobin: 13.7 g/dL (ref 13.0–17.0)
MCH: 30.6 pg (ref 26.0–34.0)
MCHC: 33.7 g/dL (ref 30.0–36.0)
MCV: 91.1 fL (ref 80.0–100.0)
Platelets: 380 10*3/uL (ref 150–400)
RBC: 4.47 MIL/uL (ref 4.22–5.81)
RDW: 13.3 % (ref 11.5–15.5)
WBC: 14.6 10*3/uL — ABNORMAL HIGH (ref 4.0–10.5)
nRBC: 0 % (ref 0.0–0.2)

## 2018-05-17 LAB — BASIC METABOLIC PANEL
Anion gap: 8 (ref 5–15)
BUN: 10 mg/dL (ref 6–20)
CO2: 25 mmol/L (ref 22–32)
Calcium: 9 mg/dL (ref 8.9–10.3)
Chloride: 106 mmol/L (ref 98–111)
Creatinine, Ser: 0.54 mg/dL — ABNORMAL LOW (ref 0.61–1.24)
GFR calc Af Amer: >60 mL/min (ref >60)
GFR calc non Af Amer: >60 mL/min (ref >60)
Glucose, Bld: 97 mg/dL (ref 70–99)
Potassium: 4.1 mmol/L (ref 3.5–5.1)
Sodium: 139 mmol/L (ref 135–145)

## 2018-05-17 LAB — HIV ANTIBODY (ROUTINE TESTING W REFLEX): HIV Screen 4th Generation wRfx: NONREACTIVE

## 2018-05-17 NOTE — Progress Notes (Signed)
OT Cancellation Note  Patient Details Name: Sean Herring MRN: [id number] DOB: [birth year]   Cancelled Treatment:    Reason Eval/Treat Not Completed: Patient declined, no reason specified. On 2nd attempt, pt continues to decline OT evaluation. Pt states, "they still don't know what they are going to do about my hand." Visibly frustrated. Emotional support provided and pt apologized for his anger. Will continue to follow acutely and re-attempt as appropriate.   Jeni Salles, MPH, MS, OTR/L ascom [phone] 05/17/18, 10:38 AM

## 2018-05-17 NOTE — Progress Notes (Signed)
Subjective: 2 Days Post-Op Procedure(s) (LRB): INCISION AND DRAINAGE (Right) Patient reports pain as 7 on 0-10 scale.   Patient is well, and has had no acute complaints or problems Plan is to go Home after hospital stay. Negative for chest pain and shortness of breath Fever: No recent fevers Gastrointestinal:Negative for nausea and vomiting  Objective: Vital signs in last 24 hours: Temp:  [97.5 F (36.4 C)-98.1 F (36.7 C)] 97.5 F (36.4 C) (02/25 0758) Pulse Rate:  [68-80] 68 (02/25 0758) Resp:  [18-19] 18 (02/25 0758) BP: (124-144)/(77-97) 140/97 (02/25 0758) SpO2:  [98 %-100 %] 100 % (02/25 0758)  Intake/Output from previous day:  Intake/Output Summary (Last 24 hours) at 05/17/2018 1338 Last data filed at 05/17/2018 0900 Gross per 24 hour  Intake 1476.52 ml  Output -  Net 1476.52 ml    Intake/Output this shift: Total I/O In: 240 [P.O.:240] Out: -   Labs: Recent Labs    05/15/18 0557 05/16/18 0438 05/17/18 0611  HGB 12.8* 12.6* 13.7   Recent Labs    05/16/18 0438 05/17/18 0611  WBC 18.9* 14.6*  RBC 4.10* 4.47  HCT 38.2* 40.7  PLT 337 380   Recent Labs    05/16/18 0438 05/17/18 0611  NA 139 139  K 3.9 4.1  CL 106 106  CO2 25 25  BUN 12 10  CREATININE 0.47* 0.54*  GLUCOSE 103* 97  CALCIUM 8.7* 9.0   No results for input(s): LABPT, INR in the last 72 hours.   EXAM General - Patient is Alert, Appropriate and Oriented Extremity - Dressing removed today, moderate old bloody drainage in the old dressing.  Dorsal aspect of the hand did a area of 2.5 x 3.5 cm where the dermal tissue was removed.  No purulent drainage was noted on exam, minimal bloody drainage.  Minimal erythema noted to the dorsal aspect of the hand.  A sterile wet-to-dry dressing was applied and ACE wrap re-applied. Intact to light touch to the dorsal aspect of the hand, cap refill intact to all fingers. Motor Function - intact, moving foot and toes well on exam.  Past Medical  History:  Diagnosis Date  . Anxiety   . Biceps tendon rupture 04/2011   s/p repair  . Bipolar 1 disorder (HCC)    no current meds x 8 years  . 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 Depression   . Diverticulitis    s/p colon surgery w/ostomy  . GERD (gastroesophageal reflux disease)   . HTN (hypertension)    off all bp meds x 8 years due to weight loss  . Hyperlipidemia   . Nausea last 6 months   due to hernias  . Syncope and collapse 2011 or 2-12 due to bp meds   Assessment/Plan: 2 Days Post-Op Procedure(s) (LRB): INCISION AND DRAINAGE (Right) Principal Problem:   Cellulitis Active Problems:   Hypertension   Hyperlipidemia   Bipolar 1 disorder (HCC)   Finger fracture, right  Estimated body mass index is 33.47 kg/m as calculated from the following:   Height as of this encounter: 5\' 4"  (1.626 m).   Weight as of this encounter: 88.5 kg. Advance diet Up with therapy   Labs reviewed this AM, WBC 14.6 this AM, improved compared to yesterday.  CBC and BMP ordered for tomorrow. Culture has grown gram positive cocci at this time. Dressing change was performed today, no purulent drainage. Plan for discharge when medically appropriate on oral antibiotics,  follow-up in the office on Friday for repeat dressing change.  DVT Prophylaxis - Lovenox, Foot Pumps and TED hose Minimal weightbearing to the right hand.  Raquel James, PA-C [facility] Orthopaedic Surgery 05/17/2018, 1:38 PM

## 2018-05-17 NOTE — Progress Notes (Signed)
[facility] NAME: Sean Herring    MR#:  [id number]  DATE OF BIRTH:  1970-10-11  SUBJECTIVE:  CHIEF COMPLAINT:   Patient is reporting swelling and pain in the right middle finger are better.  Resting comfortably today  declined OT evaluation second time  REVIEW OF SYSTEMS:  CONSTITUTIONAL: No fever, fatigue or weakness.  EYES: No blurred or double vision.  EARS, NOSE, AND THROAT: No tinnitus or ear pain.  RESPIRATORY: No cough, shortness of breath, wheezing or hemoptysis.  CARDIOVASCULAR: No chest pain, orthopnea, edema.  GASTROINTESTINAL: No nausea, vomiting, diarrhea or abdominal pain.  GENITOURINARY: No dysuria, hematuria.  ENDOCRINE: No polyuria, nocturia,  HEMATOLOGY: No anemia, easy bruising or bleeding SKIN: No rash MUSCULOSKELETAL: Right middle finger with swelling and pain NEUROLOGIC: No tingling, numbness, weakness.  PSYCHIATRY: No anxiety or depression.   DRUG ALLERGIES:  No Known Allergies  VITALS:  Blood pressure (!) 140/97, pulse 68, temperature (!) 97.5 F (36.4 C), temperature source Oral, resp. rate 18, height 5\' 4"  (1.626 m), weight 88.5 kg, SpO2 100 %.  PHYSICAL EXAMINATION:  GENERAL:  48 y.o.-year-old patient lying in the bed with no acute distress.  EYES: Pupils equal, round, reactive to light and accommodation. No scleral icterus. Extraocular muscles intact.  HEENT: Head atraumatic, normocephalic. Oropharynx and nasopharynx clear.  NECK:  Supple, no jugular venous distention. No thyroid enlargement, no tenderness.  LUNGS: Normal breath sounds bilaterally, no wheezing, rales,rhonchi or crepitation. No use of accessory muscles of respiration.  CARDIOVASCULAR: S1, S2 normal. No murmurs, rubs, or gallops.  ABDOMEN: Soft, nontender, nondistended. Bowel sounds present EXTREMITIES: Dorsum of the right hand, right middle finger at the dressing  nEUROLOGIC: Awake, alert and oriented x3 sensation  intact. Gait not checked.  PSYCHIATRIC: The patient is alert and oriented x 3.  SKIN: No obvious rash, lesion, or ulcer.    LABORATORY PANEL:   CBC Recent Labs  Lab 05/17/18 0611  WBC 14.6*  HGB 13.7  HCT 40.7  PLT 380   ------------------------------------------------------------------------------------------------------------------  Chemistries  Recent Labs  Lab 05/14/18 1324  05/17/18 0611  NA 136   < > 139  K 3.8   < > 4.1  CL 105   < > 106  CO2 23   < > 25  GLUCOSE 136*   < > 97  BUN 11   < > 10  CREATININE 0.63   < > 0.54*  CALCIUM 8.6*   < > 9.0  AST 20  --   --   ALT 32  --   --   ALKPHOS 86  --   --   BILITOT 0.4  --   --    < > = values in this interval not displayed.   ------------------------------------------------------------------------------------------------------------------  Cardiac Enzymes No results for input(s): TROPONINI in the last 168 hours. ------------------------------------------------------------------------------------------------------------------  RADIOLOGY:  No results found.  EKG:   Orders placed or performed in visit on 11/05/11  . EKG 12-Lead  . Exercise Tolerance Test    ASSESSMENT AND PLAN:     Right dorsal hand cellulitis with possible sepsis of the MCP joint of the right middle finger- patient failed outpatient antibiotic trial, will need IV antibiotics cefepime and vancomycin inpatient.   Pain management as needed Patient is status post formal irrigation and debridement of the long MCP joint-postop day #2 Cultures from 05/15/2018 gram-positive cocci in clusters, sensitivities pending.  Likely will discharge patient with Bactrim in a.m. Appreciate  orthopedics recommendations -Leukocytosis trending down 20-18.9-14.6   - comminuted right middle finger fracture -orthopedic surgery consult as above    -Gout -normal uric acid level  -Hypertension not on any medications stable at this time monitor blood pressure    -Hyperlipidemia check fasting lipid panel not on home medications     All the records are reviewed and case discussed with Care Management/Social Workerr. Management plans discussed with the patient, family and they are in agreement.  CODE STATUS: fc  TOTAL TIME TAKING CARE OF THIS PATIENT: 36 minutes.   POSSIBLE D/C IN 1-2 DAYS, DEPENDING ON CLINICAL CONDITION.  Note: This dictation was prepared with Dragon dictation along with smaller phrase technology. Any transcriptional errors that result from this process are unintentional.   Nicholes Mango M.D on 05/17/2018 at 3:23 PM  Between 7am to 6pm - Pager - [phone] After 6pm go to [email or web address] - password [email or web address] [facility]  Office  [phone]  CC: Primary care physician; Normajean Baxter, MD (Inactive)

## 2018-05-18 LAB — CBC
HCT: 40.9 % (ref 39.0–52.0)
Hemoglobin: 13.7 g/dL (ref 13.0–17.0)
MCH: 30.9 pg (ref 26.0–34.0)
MCHC: 33.5 g/dL (ref 30.0–36.0)
MCV: 92.1 fL (ref 80.0–100.0)
Platelets: 420 10*3/uL — ABNORMAL HIGH (ref 150–400)
RBC: 4.44 MIL/uL (ref 4.22–5.81)
RDW: 13.3 % (ref 11.5–15.5)
WBC: 15.4 10*3/uL — ABNORMAL HIGH (ref 4.0–10.5)
nRBC: 0 % (ref 0.0–0.2)

## 2018-05-18 LAB — LIPID PANEL
Cholesterol: 196 mg/dL (ref 0–200)
HDL: 34 mg/dL — AB (ref >40)
LDL Cholesterol: 115 mg/dL — ABNORMAL HIGH (ref 0–99)
Total CHOL/HDL Ratio: 5.8 RATIO
Triglycerides: 233 mg/dL — ABNORMAL HIGH (ref <150)
VLDL: 47 mg/dL — ABNORMAL HIGH (ref 0–40)

## 2018-05-18 MED ORDER — OXYCODONE HCL 5 MG PO TABS: 5.0000 mg | ORAL_TABLET | Freq: Four times a day (QID) | ORAL | 0 refills | Status: DC | PRN

## 2018-05-18 MED ORDER — SULFAMETHOXAZOLE-TRIMETHOPRIM 800-160 MG PO TABS: 1.0000 | ORAL_TABLET | Freq: Two times a day (BID) | ORAL | 0 refills | Status: AC

## 2018-05-18 MED ORDER — SULFAMETHOXAZOLE-TRIMETHOPRIM 800-160 MG PO TABS: 1.0000 | ORAL_TABLET | Freq: Two times a day (BID) | ORAL | Status: DC

## 2018-05-18 MED ORDER — DOCUSATE SODIUM 100 MG PO CAPS: 100.0000 mg | ORAL_CAPSULE | Freq: Two times a day (BID) | ORAL | 0 refills | Status: DC | PRN

## 2018-05-18 NOTE — Discharge Instructions (Signed)
Follow-up with primary care physician in 3 to 4 days Follow-up with orthopedics Dr. Roland Rack  in 2 days on Friday, 05/20/2018 dressing changes

## 2018-05-18 NOTE — Discharge Summary (Signed)
[facility] NAME: Sean Herring    MR#:  [id number]  DATE OF BIRTH:  [birth year]  DATE OF ADMISSION:  05/14/2018 ADMITTING PHYSICIAN: Lance Coon, MD  DATE OF DISCHARGE:  05/18/18   PRIMARY CARE PHYSICIAN: Normajean Baxter, MD (Inactive)    ADMISSION DIAGNOSIS:  Cellulitis of right hand [L03.113]  DISCHARGE DIAGNOSIS:  Principal Problem:   Cellulitis Active Problems:   Hypertension   Hyperlipidemia   Bipolar 1 disorder (HCC)   Finger fracture, right   SECONDARY DIAGNOSIS:   Past Medical History:  Diagnosis Date  . Anxiety   . Biceps tendon rupture 04/2011   s/p repair  . Bipolar 1 disorder (HCC)    no current meds x 8 years  . 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 Depression   . Diverticulitis    s/p colon surgery w/ostomy  . GERD (gastroesophageal reflux disease)   . HTN (hypertension)    off all bp meds x 8 years due to weight loss  . Hyperlipidemia   . Nausea last 6 months   due to hernias  . Syncope and collapse 2011 or 2-12 due to bp meds    HOSPITAL COURSE:  Sean Herring  is a 48 y.o. male who presents with chief complaint as above.  Patient presents to the ED with a complaint of right hand pain.  He has a cellulitis for which she was taking Bactrim in the outpatient setting, but his cellulitis progressed.  He came to the ED tonight and he has an area which looks like it may be an abscess.  ED physician tried to drain it and only got very small amount of purulent drainage out of it.  He was also found on x-ray imaging to have a fracture of his distal phalanx of his right finger, though the patient states that this is a very old injury, years old.  Hospitalist were called for admission  Hospital course  Right dorsal hand cellulitis with possible sepsis of the MCP joint of the right middle finger- patient failed outpatient antibiotic trial,  will need IV antibiotics cefepime and vancomycin inpatient.  Pain management as needed Patient is status post formal irrigation and debridement of the long MCP joint-postop day #3 Cultures from 05/15/2018 gram-positive cocci in clusters, sensitivities pending.   will discharge patient with Bactrim  Appreciate orthopedics recommendations.  Recommending patient to follow-up on Friday on 05/20/2018 for dressing change -Leukocytosis trending down 20-18.9-14.6  -comminuted right middle finger fracture -orthopedic surgery recommendations  as above Minimal weightbearing to the right hand  -Gout -normal uric acid level  -Hypertension not on any medications stable at this time monitor blood pressure   -Hyperlipidemia -LDL 75   DISCHARGE CONDITIONS:     CONSULTS OBTAINED:  Treatment Team:  Corky Mull, MD   PROCEDURES incision and drainage  DRUG ALLERGIES:  No Known Allergies  DISCHARGE MEDICATIONS:   Allergies as of 05/18/2018   No Known Allergies     Medication List    STOP taking these medications   acetaminophen 500 MG tablet Commonly known as:  TYLENOL   naproxen 250 MG tablet Commonly known as:  NAPROSYN     TAKE these medications   docusate sodium 100 MG capsule Commonly known as:  COLACE Take 1 capsule (100 mg total) by mouth 2 (two) times daily as needed for mild constipation.   ibuprofen 200 MG tablet Commonly  known as:  ADVIL,MOTRIN Take 600 mg by mouth every 8 (eight) hours as needed for headache or moderate pain.   oxyCODONE 5 MG immediate release tablet Commonly known as:  Oxy IR/ROXICODONE Take 1-2 tablets (5-10 mg total) by mouth every 6 (six) hours as needed for moderate pain (pain score 4-6).   sulfamethoxazole-trimethoprim 800-160 MG tablet Commonly known as:  BACTRIM DS,SEPTRA DS Take 1 tablet by mouth every 12 (twelve) hours for 7 days. What changed:  when to take this        DISCHARGE INSTRUCTIONS:   Follow-up with primary care  physician in 3 to 4 days Follow-up with orthopedics Dr. Roland Rack  in 2 days on Friday, 05/20/2018 dressing changes  DIET:  Cardiac diet  DISCHARGE CONDITION:  Fair  ACTIVITY:  Activity as tolerated  OXYGEN:  Home Oxygen: No.   Oxygen Delivery: room air  DISCHARGE LOCATION:  home   If you experience worsening of your admission symptoms, develop shortness of breath, life threatening emergency, suicidal or homicidal thoughts you must seek medical attention immediately by calling 911 or calling your MD immediately  if symptoms less severe.  You Must read complete instructions/literature along with all the possible adverse reactions/side effects for all the Medicines you take and that have been prescribed to you. Take any new Medicines after you have completely understood and accpet all the possible adverse reactions/side effects.   Please note  You were cared for by a hospitalist during your hospital stay. If you have any questions about your discharge medications or the care you received while you were in the hospital after you are discharged, you can call the unit and asked to speak with the hospitalist on call if the hospitalist that took care of you is not available. Once you are discharged, your primary care physician will handle any further medical issues. Please note that NO REFILLS for any discharge medications will be authorized once you are discharged, as it is imperative that you return to your primary care physician (or establish a relationship with a primary care physician if you do not have one) for your aftercare needs so that they can reassess your need for medications and monitor your lab values.     Today  Chief Complaint  Patient presents with  . Hand Pain   Patient is doing okay okay to discharge patient from orthopedic standpoint with antibiotics  ROS:  CONSTITUTIONAL: Denies fevers, chills. Denies any fatigue, weakness.  EYES: Denies blurry vision, double vision,  eye pain. EARS, NOSE, THROAT: Denies tinnitus, ear pain, hearing loss. RESPIRATORY: Denies cough, wheeze, shortness of breath.  CARDIOVASCULAR: Denies chest pain, palpitations, edema.  GASTROINTESTINAL: Denies nausea, vomiting, diarrhea, abdominal pain. Denies bright red blood per rectum. GENITOURINARY: Denies dysuria, hematuria. ENDOCRINE: Denies nocturia or thyroid problems. HEMATOLOGIC AND LYMPHATIC: Denies easy bruising or bleeding. SKIN: Denies rash or lesion. MUSCULOSKELETAL: Denies pain in neck, back, shoulder, knees, hips or arthritic symptoms.  Minimum pain in the right hand NEUROLOGIC: Denies paralysis, paresthesias.  PSYCHIATRIC: Denies anxiety or depressive symptoms.   VITAL SIGNS:  Blood pressure (!) 136/97, pulse 72, temperature 98.8 F (37.1 C), temperature source Oral, resp. rate 18, height 5\' 4"  (1.626 m), weight 88.5 kg, SpO2 99 %.  I/O:    Intake/Output Summary (Last 24 hours) at 05/18/2018 1226 Last data filed at 05/18/2018 0900 Gross per 24 hour  Intake 480 ml  Output -  Net 480 ml    PHYSICAL EXAMINATION:  GENERAL:  48 y.o.-year-old patient lying  in the bed with no acute distress.  EYES: Pupils equal, round, reactive to light and accommodation. No scleral icterus. Extraocular muscles intact.  HEENT: Head atraumatic, normocephalic. Oropharynx and nasopharynx clear.  NECK:  Supple, no jugular venous distention. No thyroid enlargement, no tenderness.  LUNGS: Normal breath sounds bilaterally, no wheezing, rales,rhonchi or crepitation. No use of accessory muscles of respiration.  CARDIOVASCULAR: S1, S2 normal. No murmurs, rubs, or gallops.  ABDOMEN: Soft, non-tender, non-distended. Bowel sounds present.  EXTREMITIES: Clean dressing on the right hand no purulent discharge.  Cap refill intact to all fingers, sensory intact to light touch  no pedal edema, cyanosis, or clubbing.  NEUROLOGIC: Awake, alert and oriented x3. Sensation intact. Gait not checked.   PSYCHIATRIC: The patient is alert and oriented x 3.  SKIN: No obvious rash, lesion, or ulcer.   DATA REVIEW:   CBC Recent Labs  Lab 05/18/18 0346  WBC 15.4*  HGB 13.7  HCT 40.9  PLT 420*    Chemistries  Recent Labs  Lab 05/14/18 1324  05/17/18 0611  NA 136   < > 139  K 3.8   < > 4.1  CL 105   < > 106  CO2 23   < > 25  GLUCOSE 136*   < > 97  BUN 11   < > 10  CREATININE 0.63   < > 0.54*  CALCIUM 8.6*   < > 9.0  AST 20  --   --   ALT 32  --   --   ALKPHOS 86  --   --   BILITOT 0.4  --   --    < > = values in this interval not displayed.    Cardiac Enzymes No results for input(s): TROPONINI in the last 168 hours.  Microbiology Results  Results for orders placed or performed during the hospital encounter of 05/14/18  Aerobic/Anaerobic Culture (surgical/deep wound)     Status: None (Preliminary result)   Collection Time: 05/15/18  6:06 PM  Result Value Ref Range Status   Specimen Description   Final    WOUND Performed at [facility], [address], [place], [place] [address]    Special Requests   Final    RIGHT MIDDLE FINGER PATIENT ON FOLLOWING BACTRIM,MAXIPIME,VANCOMYCIN FLAGYL   Gram Stain   Final    RARE WBC PRESENT, PREDOMINANTLY PMN RARE GRAM POSITIVE COCCI IN CLUSTERS Performed at [facility], [place] [address], [place], [place] [address]    Culture   Final    FEW METHICILLIN RESISTANT STAPHYLOCOCCUS AUREUS NO ANAEROBES ISOLATED; CULTURE IN PROGRESS FOR 5 DAYS    Report Status PENDING  Incomplete   Organism ID, Bacteria METHICILLIN RESISTANT STAPHYLOCOCCUS AUREUS  Final      Susceptibility   Methicillin resistant staphylococcus aureus - MIC*    CIPROFLOXACIN <=0.5 SENSITIVE Sensitive     ERYTHROMYCIN >=8 RESISTANT Resistant     GENTAMICIN <=0.5 SENSITIVE Sensitive     OXACILLIN >=4 RESISTANT Resistant     TETRACYCLINE <=1 SENSITIVE Sensitive     VANCOMYCIN <=0.5 SENSITIVE Sensitive     TRIMETH/SULFA <=10 SENSITIVE Sensitive      CLINDAMYCIN <=0.25 SENSITIVE Sensitive     RIFAMPIN <=0.5 SENSITIVE Sensitive     Inducible Clindamycin NEGATIVE Sensitive     * FEW METHICILLIN RESISTANT STAPHYLOCOCCUS AUREUS    RADIOLOGY:  Dg Hand 2 View Right  Result Date: 05/14/2018 CLINICAL DATA:  Right hand pain, swelling over 2nd and 3rd MTP joints  EXAM: RIGHT HAND - 2 VIEW COMPARISON:  None. FINDINGS: Soft tissue swelling along the dorsum of the hand and within the right middle finger. Comminuted fracture at the tip of the right middle finger, age indeterminate. No additional fracture. No subluxation or dislocation. IMPRESSION: Comminuted fracture of the tip of the right middle finger distal phalanx, age indeterminate. If there is remote history of injury, this could be chronic as the bone fragments appear corticated. No additional fracture. Electronically Signed   By: Rolm Baptise M.D.   On: 05/14/2018 21:13    EKG:   Orders placed or performed in visit on 11/05/11  . EKG 12-Lead  . Exercise Tolerance Test      Management plans discussed with the patient, family and they are in agreement.  CODE STATUS:     Code Status Orders  (From admission, onward)         Start     Ordered   05/15/18 0149  Full code  Continuous     05/15/18 0148        Code Status History    Date Active Date Inactive Code Status Order ID Comments User Context   11/25/2017 1605 11/28/2017 1439 Full Code [id number]  Michael Boston, MD Inpatient      TOTAL TIME TAKING CARE OF THIS PATIENT: 43  minutes.   Note: This dictation was prepared with Dragon dictation along with smaller phrase technology. Any transcriptional errors that result from this process are unintentional.   @[facility] @  on 05/18/2018 at 12:26 PM  Between 7am to 6pm - Pager - [phone]  After 6pm go to [email or web address] - password [email or web address] [facility]  Office  [phone]  CC: Primary care physician; Normajean Baxter, MD (Inactive)

## 2018-05-18 NOTE — Progress Notes (Signed)
OT Cancellation Note  Patient Details Name: Sean Herring MRN: [id number] DOB: Feb 28, 1971   Cancelled Treatment:    Reason Eval/Treat Not Completed: Patient declined, no reason specified. 3rd attempt to evaluate pt for OT. Spouse in room requesting OT do not wake pt up. Pt sleeping, lifts head briefly to look at therapist and goes back to sleep. Will complete OT order at this time 2/2 refusal to participate. Please re-consult as appropriate should pt be more agreeable to therapy in the future.  Jeni Salles, MPH, MS, OTR/L ascom [phone] 05/18/18, 10:48 AM

## 2018-05-18 NOTE — Progress Notes (Signed)
Subjective: 3 Days Post-Op Procedure(s) (LRB): INCISION AND DRAINAGE (Right) Patient reports pain as 7 on 0-10 scale.   Patient is well, and has had no acute complaints or problems Plan is to go Home after hospital stay. Negative for chest pain and shortness of breath Fever: No recent fevers Gastrointestinal:Negative for nausea and vomiting  Objective: Vital signs in last 24 hours: Temp:  [97.8 F (36.6 C)] 97.8 F (36.6 C) (02/25 2356) Pulse Rate:  [70] 70 (02/25 2356) Resp:  [20] 20 (02/25 2356) BP: (129)/(83) 129/83 (02/25 2356) SpO2:  [97 %] 97 % (02/25 2356)  Intake/Output from previous day:  Intake/Output Summary (Last 24 hours) at 05/18/2018 0801 Last data filed at 05/17/2018 1300 Gross per 24 hour  Intake 480 ml  Output -  Net 480 ml    Intake/Output this shift: No intake/output data recorded.  Labs: Recent Labs    05/16/18 0438 05/17/18 0611  HGB 12.6* 13.7   Recent Labs    05/16/18 0438 05/17/18 0611  WBC 18.9* 14.6*  RBC 4.10* 4.47  HCT 38.2* 40.7  PLT 337 380   Recent Labs    05/16/18 0438 05/17/18 0611  NA 139 139  K 3.9 4.1  CL 106 106  CO2 25 25  BUN 12 10  CREATININE 0.47* 0.54*  GLUCOSE 103* 97  CALCIUM 8.7* 9.0   No results for input(s): LABPT, INR in the last 72 hours.   EXAM General - Patient is Alert, Appropriate and Oriented Extremity - Dressing intact, no purulent drainage noted this AM.  ABle to flex and extend middle finger slightly with moderate pain. Intact to light touch to the dorsal aspect of the hand, cap refill intact to all fingers. Motor Function - intact, moving foot and toes well on exam.  Past Medical History:  Diagnosis Date  . Anxiety   . Biceps tendon rupture 04/2011   s/p repair  . Bipolar 1 disorder (HCC)    no current meds x 8 years  . 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 Depression   . Diverticulitis    s/p colon surgery w/ostomy  .  GERD (gastroesophageal reflux disease)   . HTN (hypertension)    off all bp meds x 8 years due to weight loss  . Hyperlipidemia   . Nausea last 6 months   due to hernias  . Syncope and collapse 2011 or 2-12 due to bp meds   Assessment/Plan: 3 Days Post-Op Procedure(s) (LRB): INCISION AND DRAINAGE (Right) Principal Problem:   Cellulitis Active Problems:   Hypertension   Hyperlipidemia   Bipolar 1 disorder (HCC)   Finger fracture, right  Estimated body mass index is 33.47 kg/m as calculated from the following:   Height as of this encounter: 5\' 4"  (1.626 m).   Weight as of this encounter: 88.5 kg. Advance diet Up with therapy   CBC ordered for this morning to recheck WBC, no recent fevers. Culture has grown gram positive cocci at this time.  Will likely discharge on Bactrim. No increase in drainage today, plan for follow-up in the office on Friday for dressing change.  DVT Prophylaxis - Lovenox, Foot Pumps and TED hose Minimal weightbearing to the right hand.  Raquel Delorse Shane, PA-C [facility] 05/18/2018, 8:01 AM

## 2018-05-20 LAB — AEROBIC/ANAEROBIC CULTURE W GRAM STAIN (SURGICAL/DEEP WOUND)

## 2018-05-20 LAB — AEROBIC/ANAEROBIC CULTURE (SURGICAL/DEEP WOUND)

## 2018-07-14 ENCOUNTER — Emergency Department
Admission: EM | Admit: 2018-07-14 | Discharge: 2018-07-14 | Disposition: A | Discharge status: Home / Self Care | Payer: 59 | Attending: Student in an Organized Health Care Education/Training Program | Admitting: Student in an Organized Health Care Education/Training Program

## 2018-07-14 ENCOUNTER — Encounter: Payer: Self-pay | Admitting: *Deleted

## 2018-07-14 ENCOUNTER — Other Ambulatory Visit: Payer: Self-pay

## 2018-07-14 DIAGNOSIS — F439 Reaction to severe stress, unspecified: Secondary | ICD-10-CM | POA: Diagnosis not present

## 2018-07-14 DIAGNOSIS — R5381 Other malaise: Secondary | ICD-10-CM | POA: Diagnosis present

## 2018-07-14 DIAGNOSIS — F329 Major depressive disorder, single episode, unspecified: Secondary | ICD-10-CM

## 2018-07-14 DIAGNOSIS — I10 Essential (primary) hypertension: Secondary | ICD-10-CM | POA: Insufficient documentation

## 2018-07-14 DIAGNOSIS — F320 Major depressive disorder, single episode, mild: Secondary | ICD-10-CM

## 2018-07-14 DIAGNOSIS — F1120 Opioid dependence, uncomplicated: Secondary | ICD-10-CM

## 2018-07-14 DIAGNOSIS — F17210 Nicotine dependence, cigarettes, uncomplicated: Secondary | ICD-10-CM | POA: Insufficient documentation

## 2018-07-14 LAB — CBC WITH DIFFERENTIAL/PLATELET
Abs Immature Granulocytes: 0.08 10*3/uL — ABNORMAL HIGH (ref 0.00–0.07)
Basophils Absolute: 0.1 10*3/uL (ref 0.0–0.1)
Basophils Relative: 0 %
Eosinophils Absolute: 0.2 10*3/uL (ref 0.0–0.5)
Eosinophils Relative: 1 %
HCT: 45.2 % (ref 39.0–52.0)
Hemoglobin: 15.3 g/dL (ref 13.0–17.0)
Immature Granulocytes: 1 %
Lymphocytes Relative: 21 %
Lymphs Abs: 3.6 10*3/uL (ref 0.7–4.0)
MCH: 31.4 pg (ref 26.0–34.0)
MCHC: 33.8 g/dL (ref 30.0–36.0)
MCV: 92.8 fL (ref 80.0–100.0)
Monocytes Absolute: 1.3 10*3/uL — ABNORMAL HIGH (ref 0.1–1.0)
Monocytes Relative: 7 %
Neutro Abs: 12.2 10*3/uL — ABNORMAL HIGH (ref 1.7–7.7)
Neutrophils Relative %: 70 %
Platelets: 317 10*3/uL (ref 150–400)
RBC: 4.87 MIL/uL (ref 4.22–5.81)
RDW: 13.3 % (ref 11.5–15.5)
WBC: 17.4 10*3/uL — ABNORMAL HIGH (ref 4.0–10.5)
nRBC: 0 % (ref 0.0–0.2)

## 2018-07-14 LAB — COMPREHENSIVE METABOLIC PANEL WITH GFR
ALT: 23 U/L (ref 0–44)
AST: 25 U/L (ref 15–41)
Albumin: 4.4 g/dL (ref 3.5–5.0)
Alkaline Phosphatase: 81 U/L (ref 38–126)
Anion gap: 11 (ref 5–15)
BUN: 10 mg/dL (ref 6–20)
CO2: 24 mmol/L (ref 22–32)
Calcium: 9.3 mg/dL (ref 8.9–10.3)
Chloride: 105 mmol/L (ref 98–111)
Creatinine, Ser: 0.75 mg/dL (ref 0.61–1.24)
GFR calc Af Amer: >60 mL/min
GFR calc non Af Amer: >60 mL/min
Glucose, Bld: 108 mg/dL — ABNORMAL HIGH (ref 70–99)
Potassium: 4 mmol/L (ref 3.5–5.1)
Sodium: 140 mmol/L (ref 135–145)
Total Bilirubin: 0.5 mg/dL (ref 0.3–1.2)
Total Protein: 8 g/dL (ref 6.5–8.1)

## 2018-07-14 MED ORDER — GABAPENTIN 300 MG PO CAPS
300.0000 mg | ORAL_CAPSULE | Freq: Once | ORAL | Status: AC
  Administered 2018-07-14: 19:00:00 300 mg via ORAL
  Filled 2018-07-14: qty 1

## 2018-07-14 MED ORDER — GABAPENTIN 100 MG PO CAPS: 100.0000 mg | ORAL_CAPSULE | Freq: Three times a day (TID) | ORAL | 2 refills | Status: DC

## 2018-07-14 NOTE — ED Triage Notes (Addendum)
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 Pt was placed on 20 mg Percocet's in September 2019 after abd surgery.  Pt to ED today reporting his marriage has been effected by the pain medication use and he is wanting to detox. Last does of medication was on the 3/20 and he is now experiencing increased anxiety and generalized body aches.   Pt reporting to this RN multiple different episodes of painful situations over the past year that justified pain medications but is talking quickly and changing topics multiple times.

## 2018-07-14 NOTE — ED Provider Notes (Signed)
[facility] Emergency Department Provider Note    First MD Initiated Contact with Patient 07/14/18 1853     (approximate)  I have reviewed the triage vital signs and the nursing notes.   HISTORY  Chief Complaint Detox    HPI Kameron Blethen is a 48 y.o. male below listed past medical history presents to the ER for generalized malaise and "Jonesin' for pain pills ".  Patient was speaking with his primary doctor  earlier today and was directed to the ER for detox.  States the last time he used any narcotic medication was at the end of March.  States that he ran out of his chronic gabapentin that he had used for "mood stabilization "2 weeks ago and started to feel worse since then.  Denies any SI or HI.  States he does feel anxious.   Past Medical History:  Diagnosis Date  . Anxiety   . Biceps tendon rupture 04/2011   s/p repair  . Bipolar 1 disorder (HCC)    no current meds x 8 years  . 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 Depression   . Diverticulitis    s/p colon surgery w/ostomy  . GERD (gastroesophageal reflux disease)   . HTN (hypertension)    off all bp meds x 8 years due to weight loss  . Hyperlipidemia   . Nausea last 6 months   due to hernias  . Syncope and collapse 2011 or 2-12 due to bp meds   Family History  Problem Relation Age of Onset  . Depression Mother   . Heart disease Maternal Uncle   . Heart attack Maternal Grandmother   . Heart attack Maternal Grandfather    Past Surgical History:  Procedure Laterality Date  . APPENDECTOMY  2010  . BICEPS TENDON REPAIR Right 04/2011  . COLECTOMY WITH COLOSTOMY CREATION/HARTMANN PROCEDURE  01/2009   perf diverticulitis.  [facility] Dr Elta Guadeloupe Bird/Dr Geryl Rankins  . COLOSTOMY TAKEDOWN  07/2009   [facility]  . INCISION AND DRAINAGE Right 05/15/2018   Procedure: INCISION AND DRAINAGE;  Surgeon: Corky Mull, MD;  Location: [facility] ORS;  Service:  Orthopedics;  Laterality: Right;  . INGUINAL HERNIA REPAIR Right 11/25/2017   Procedure: LAPAROSCOPIC bilateral inguinal hernia,;  Surgeon: Michael Boston, MD;  Location: [facility] ORS;  Service: General;  Laterality: Right;  . INSERTION OF MESH Bilateral 11/25/2017   Procedure: INSERTION OF MESH for bilateral inguinal hernias and incisional hernia X3;  Surgeon: Michael Boston, MD;  Location: [facility];  Service: General;  Laterality: Bilateral;  . ROTATOR CUFF REPAIR Right 2011   Right shoulder 2011, and Left shoulder 2015  . ROTATOR CUFF REPAIR Left 2015  . VENTRAL HERNIA REPAIR N/A 11/25/2017   Procedure: LAPAROSCOPIC incisional HERNIA WITH LYSIS OF ADHESIONS ERAS PATHWAY;  Surgeon: Michael Boston, MD;  Location: [facility];  Service: General;  Laterality: N/A;  BILATERAL TAP BLOCK   Patient Active Problem List   Diagnosis Date Noted  . Cellulitis 05/15/2018  . Finger fracture, right 05/15/2018  . Bilateral inguinal herniae s/p lap repair w mesh 11/25/2017 11/26/2017  . Incarcerated incisional hernia s/p lap repair w mesh 11/25/2017 11/25/2017  . Tobacco abuse 10/08/2017  . Patient difficult to contact 10/08/2017  . Constipation, chronic 10/08/2017  . History of no-show for appointments 10/08/2017  . Bipolar 1 disorder ([place])   . Left rotator cuff tear arthropathy 03/12/2015  . Left shoulder pain 06/26/2014  .  Chronic abdominal pain 08/24/2013  . Fatigue 08/24/2013  . MDD (major depressive disorder) 08/24/2013  . Chronic nausea 09/13/2012  . Family history of heart disease in male family member before age 95 11/03/2011  . Hyperlipidemia 07/17/2011  . Hypertension 06/16/2011  . History of colonic diverticulitis 03/23/2009      Prior to Admission medications   Medication Sig Start Date End Date Taking? Authorizing Provider  docusate sodium (COLACE) 100 MG capsule Take 1 capsule (100 mg total) by mouth 2 (two) times daily as needed for mild constipation. 05/18/18   Nicholes Mango, MD  gabapentin (NEURONTIN)  100 MG capsule Take 1 capsule (100 mg total) by mouth 3 (three) times daily. 07/14/18 07/14/19  Merlyn Lot, MD  ibuprofen (ADVIL,MOTRIN) 200 MG tablet Take 600 mg by mouth every 8 (eight) hours as needed for headache or moderate pain.    [provider]  oxyCODONE (OXY IR/ROXICODONE) 5 MG immediate release tablet Take 1-2 tablets (5-10 mg total) by mouth every 6 (six) hours as needed for moderate pain (pain score 4-6). 05/18/18   Nicholes Mango, MD    Allergies Patient has no known allergies.    Social History Social History   Tobacco Use  . Smoking status: Current Every Day Smoker    Packs/day: 1.00    Years: 30.00    Pack years: 30.00    Types: Cigarettes  . Smokeless tobacco: Never Used  Substance Use Topics  . Alcohol use: No    Comment: rare  . Drug use: Not Currently    Types: Marijuana    Comment: marijuana in past    Review of Systems Patient denies headaches, rhinorrhea, blurry vision, numbness, shortness of breath, chest pain, edema, cough, abdominal pain, nausea, vomiting, diarrhea, dysuria, fevers, rashes or hallucinations unless otherwise stated above in HPI. ____________________________________________   PHYSICAL EXAM:  VITAL SIGNS: Vitals:   07/14/18 1840  BP: (!) 150/92  Pulse: 88  Resp: 16  Temp: 98.1 F (36.7 C)  SpO2: 96%    Constitutional: Alert and oriented.  Eyes: Conjunctivae are normal.  Head: Atraumatic. Nose: No congestion/rhinnorhea. Mouth/Throat: Mucous membranes are moist.   Neck: No stridor. Painless ROM.  Cardiovascular: Normal rate, regular rhythm. Grossly normal heart sounds.  Good peripheral circulation. Respiratory: Normal respiratory effort.  No retractions. Lungs CTAB. Gastrointestinal: Soft and nontender. No distention. No abdominal bruits. No CVA tenderness. Genitourinary:  Musculoskeletal: No lower extremity tenderness nor edema.  No joint effusions. Neurologic:  Normal speech and language. No gross focal  neurologic deficits are appreciated. No facial droop Skin:  Skin is warm, dry and intact. No rash noted. Psychiatric: Mood and affect are anxious. Speech and behavior are normal.  ____________________________________________   LABS (all labs ordered are listed, but only abnormal results are displayed)  Results for orders placed or performed during the hospital encounter of 07/14/18 (from the past 24 hour(s))  CBC with Differential/Platelet     Status: Abnormal   Collection Time: 07/14/18  7:22 PM  Result Value Ref Range   WBC 17.4 (H) 4.0 - 10.5 K/uL   RBC 4.87 4.22 - 5.81 MIL/uL   Hemoglobin 15.3 13.0 - 17.0 g/dL   HCT 45.2 39.0 - 52.0 %   MCV 92.8 80.0 - 100.0 fL   MCH 31.4 26.0 - 34.0 pg   MCHC 33.8 30.0 - 36.0 g/dL   RDW 13.3 11.5 - 15.5 %   Platelets 317 150 - 400 K/uL   nRBC 0.0 0.0 - 0.2 %  Neutrophils Relative % 70 %   Neutro Abs 12.2 (H) 1.7 - 7.7 K/uL   Lymphocytes Relative 21 %   Lymphs Abs 3.6 0.7 - 4.0 K/uL   Monocytes Relative 7 %   Monocytes Absolute 1.3 (H) 0.1 - 1.0 K/uL   Eosinophils Relative 1 %   Eosinophils Absolute 0.2 0.0 - 0.5 K/uL   Basophils Relative 0 %   Basophils Absolute 0.1 0.0 - 0.1 K/uL   Immature Granulocytes 1 %   Abs Immature Granulocytes 0.08 (H) 0.00 - 0.07 K/uL  Comprehensive metabolic panel     Status: Abnormal   Collection Time: 07/14/18  7:22 PM  Result Value Ref Range   Sodium 140 135 - 145 mmol/L   Potassium 4.0 3.5 - 5.1 mmol/L   Chloride 105 98 - 111 mmol/L   CO2 24 22 - 32 mmol/L   Glucose, Bld 108 (H) 70 - 99 mg/dL   BUN 10 6 - 20 mg/dL   Creatinine, Ser 0.75 0.61 - 1.24 mg/dL   Calcium 9.3 8.9 - 10.3 mg/dL   Total Protein 8.0 6.5 - 8.1 g/dL   Albumin 4.4 3.5 - 5.0 g/dL   AST 25 15 - 41 U/L   ALT 23 0 - 44 U/L   Alkaline Phosphatase 81 38 - 126 U/L   Total Bilirubin 0.5 0.3 - 1.2 mg/dL   GFR calc non Af Amer >60 >60 mL/min   GFR calc Af Amer >60 >60 mL/min   Anion gap 11 5 - 15    ____________________________________________ ____________________________________________  RADIOLOGY   ____________________________________________   PROCEDURES  Procedure(s) performed:  Procedures    Critical Care performed: no ____________________________________________   INITIAL IMPRESSION / ASSESSMENT AND PLAN / ED COURSE  Pertinent labs & imaging results that were available during my care of the patient were reviewed by me and considered in my medical decision making (see chart for details).   DDX: anxiety, withdrawal, depression, anxiety  Yordin Rhoda is a 48 y.o. who presents to the ED with symptoms as described above.  Patient afebrile hemodynamically stable with reassuring exam.  Psychiatry was consulted.  Not felt to meet criteria for inpatient rehab or evaluation.  Does have mild leukocytosis but is otherwise asymptomatic.  Seems to always carry a little bit of the like leukocytosis.  Patient states he did run out of Neurontin.  We will give him a refill of his previous stated dose of Neurontin and referral back to his PCP.  Have discussed with the patient and available family all diagnostics and treatments performed thus far and all questions were answered to the best of my ability. The patient demonstrates understanding and agreement with plan.      The patient was evaluated in Emergency Department today for the symptoms described in the history of present illness. He/she was evaluated in the context of the global COVID-19 pandemic, which necessitated consideration that the patient might be at risk for infection with the SARS-CoV-2 virus that causes COVID-19. Institutional protocols and algorithms that pertain to the evaluation of patients at risk for COVID-19 are in a state of rapid change based on information released by regulatory bodies including the CDC and federal and state organizations. These policies and algorithms were followed during the patient's care  in the ED.  As part of my medical decision making, I reviewed the following data within the [facility] notes reviewed and incorporated, Labs reviewed, notes from prior ED visits and [place] Controlled Substance  Database   ____________________________________________   FINAL CLINICAL IMPRESSION(S) / ED DIAGNOSES  Final diagnoses:  Stress  Narcotic dependence ([place])      NEW MEDICATIONS STARTED DURING THIS VISIT:  New Prescriptions   GABAPENTIN (NEURONTIN) 100 MG CAPSULE    Take 1 capsule (100 mg total) by mouth 3 (three) times daily.     Note:  This document was prepared using Dragon voice recognition software and may include unintentional dictation errors.    Merlyn Lot, MD 07/14/18 2232

## 2018-07-14 NOTE — ED Notes (Addendum)
Psych NP here to speak with pt.
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
## 2018-07-14 NOTE — ED Notes (Signed)
Pt reports to this RN that his PCP took him off gabapentin about 2 weeks ago. Pt reports over the last few days thinking that he was having withdrawals from opiates as he also has been on percocet which he recently stopped. Pt reports having pain over various areas of this body. Pt does not endorse SI/HI or AVH. Pt is calm and cooperative at this time.

## 2019-04-10 IMAGING — DX DG HAND 2V*R*
2 series · 2 of 2 positions shown · non-contrast
Comparison: None.

CLINICAL DATA: Right hand pain, swelling over 2nd and 3rd MTP
joints

EXAM:
RIGHT HAND - 2 VIEW

[hand ap]
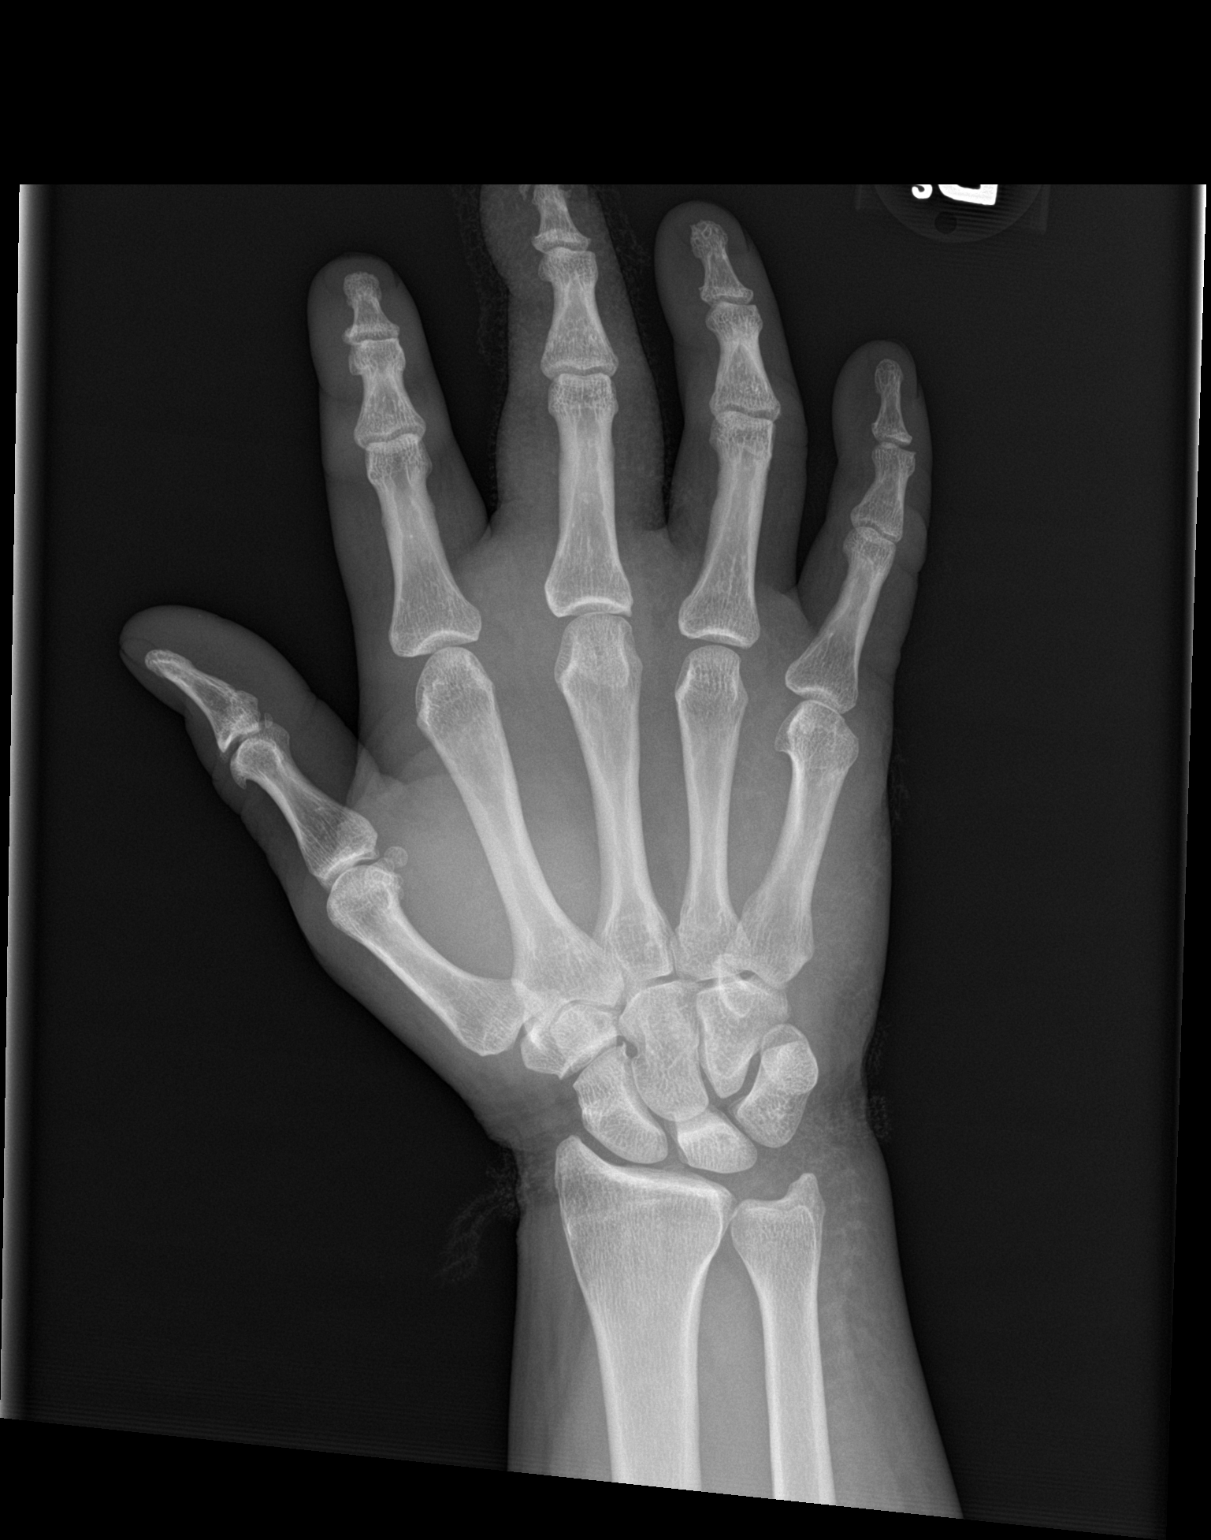

[hand lat]
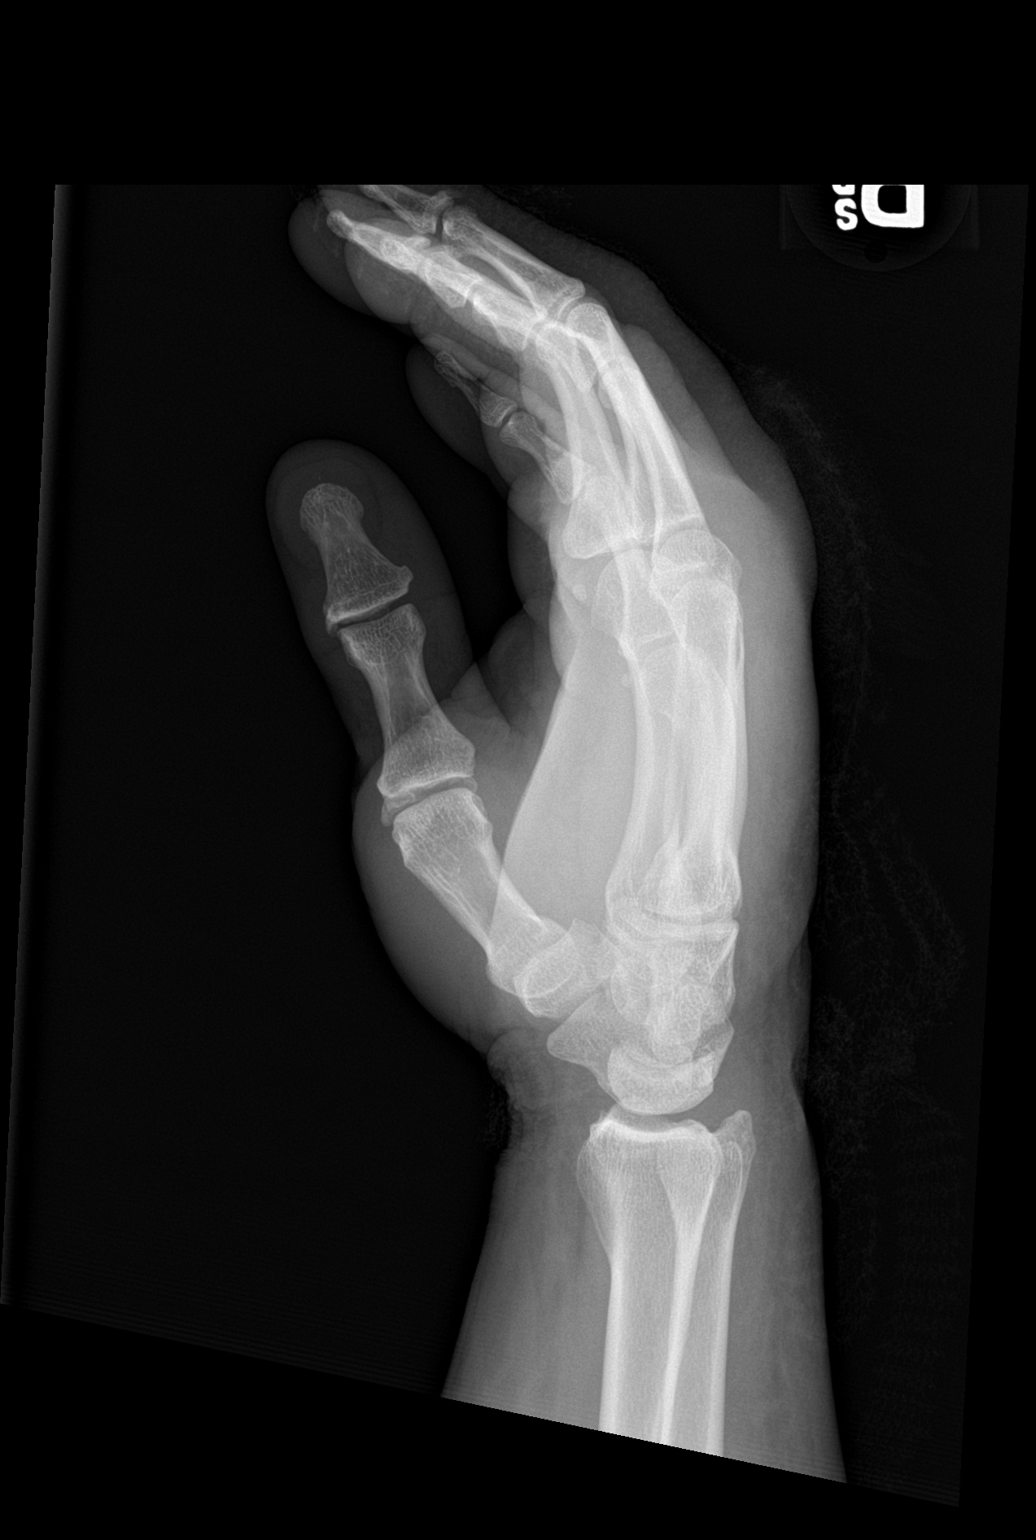

[2 of 2 positions shown; findings below may reference images not displayed]

FINDINGS: Soft tissue swelling along the dorsum of the hand and within the
right middle finger. Comminuted fracture at the tip of the right
middle finger, age indeterminate. No additional fracture. No
subluxation or dislocation.
IMPRESSION: Comminuted fracture of the tip of the right middle finger distal
phalanx, age indeterminate. If there is remote history of injury,
this could be chronic as the bone fragments appear corticated. No
additional fracture.

## 2024-01-11 ENCOUNTER — Telehealth: Payer: Self-pay

## 2024-01-11 NOTE — Telephone Encounter (Signed)
 Copied from CRM #[id number]. Topic: Appointments - Scheduling Inquiry for Clinic >> Jan 11, 2024 12:24 PM Amber H wrote: Reason for CRM: Patient called to est care with Dr. Michelene Cower however, I did advise that her she will not be in the office from 10/03-11/03. I did attempt to schd  him after those dates however, there was no schd available for the provider. Stated his wife referred him to that specific doctor. Wanted to know if he can make and appt with her. Declined to make an appt with a different provider.    [phone]

## 2024-03-20 ENCOUNTER — Other Ambulatory Visit: Payer: Self-pay | Admitting: Medical Genetics

## 2024-03-24 ENCOUNTER — Encounter: Payer: Self-pay | Admitting: Nurse Practitioner

## 2024-03-24 ENCOUNTER — Ambulatory Visit: Admitting: Nurse Practitioner

## 2024-03-24 VITALS — BP 136/92 | HR 69 | Temp 98.0°F | Ht 64.0 in | Wt 204.0 lb

## 2024-03-24 DIAGNOSIS — E782 Mixed hyperlipidemia: Secondary | ICD-10-CM

## 2024-03-24 DIAGNOSIS — Z125 Encounter for screening for malignant neoplasm of prostate: Secondary | ICD-10-CM

## 2024-03-24 DIAGNOSIS — Z130 Encounter for screening for diseases of the blood and blood-forming organs and certain disorders involving the immune mechanism: Secondary | ICD-10-CM

## 2024-03-24 DIAGNOSIS — Z1159 Encounter for screening for other viral diseases: Secondary | ICD-10-CM

## 2024-03-24 DIAGNOSIS — Z7689 Persons encountering health services in other specified circumstances: Secondary | ICD-10-CM

## 2024-03-24 DIAGNOSIS — Z1211 Encounter for screening for malignant neoplasm of colon: Secondary | ICD-10-CM

## 2024-03-24 DIAGNOSIS — Z23 Encounter for immunization: Secondary | ICD-10-CM | POA: Diagnosis not present

## 2024-03-24 DIAGNOSIS — I10 Essential (primary) hypertension: Secondary | ICD-10-CM | POA: Diagnosis not present

## 2024-03-24 DIAGNOSIS — Z720 Tobacco use: Secondary | ICD-10-CM | POA: Diagnosis not present

## 2024-03-24 DIAGNOSIS — Z131 Encounter for screening for diabetes mellitus: Secondary | ICD-10-CM

## 2024-03-24 NOTE — Progress Notes (Signed)
 "  BP (!) 136/92   Pulse 69   Temp 98 F (36.7 C)   Ht 5' 4 (1.626 m)   Wt 204 lb (92.5 kg)   SpO2 98%   BMI 35.02 kg/m    Subjective:    Patient ID: Sean Herring, male    DOB: 1970-09-19, 54 y.o.   MRN: [id number]  HPI: Sean Herring is a 54 y.o. male  Chief Complaint  Patient presents with   Establish Care   Discussed the use of AI scribe software for clinical note transcription with the patient, who gave verbal consent to proceed.  History of Present Illness Sean Herring is a 54 year old male who presents to establish care.  Hyperlipidemia - Last lipid panel in 2020 showed LDL of 115 mg/dL - No current cholesterol-lowering medication  Hypertension - Not currently on antihypertensive medication - Experienced significant hypotension with amlodipine  and carvedilol  in 2013-2014, resulting in syncope and a fall - No current antihypertensive therapy  History of diverticulitis and bowel resection - History of diverticulitis requiring emergency colonoscopy and resection of 13 inches of lower intestine and sigmoid colon at age 23 - No recurrent episodes of diverticulitis since surgery Last colonoscopy was back in 2011 -notes from gi  02/2017 for diverticulitis as a referral. . He had an emergency colostomy for diverticulitis 8 years back and reversed in 2008 . 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 Treated with antibiotics. Did not have any further surgery. 30 pack years of smoking . He was commenced on a course of ciprofloixacin and metronidazole  on 02/26/17 by Edman Marsa PARAS, DO . Last colonoscopy in 2011 after the stoma was taken out- didn't recall any gross abnormality. No family history of colon cancer or polyps.  Sean Herring is a 54 y.o. y/o male  Here to follow up  for diverticulitis.He is due for a colonoscopy for his diverticulitis . 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 I have advised him to stop smoking . 2 CT abdomens over the past 7 months have not shown any abnormalities of the pancreas.   Chronic obstructive pulmonary disease (copd) - Diagnosed with COPD in 2019 - Previously prescribed inhalation medication, not currently using any COPD medication - No recent respiratory symptoms - Quit smoking approximately three years ago after long-term tobacco use         03/24/2024    2:50 PM 05/25/2017    4:58 PM 02/26/2017    4:10 PM  Depression screen PHQ 2/9  Decreased Interest 0 1 0  Down, Depressed, Hopeless 0 1 0  PHQ - 2 Score 0 2 0  Altered sleeping  2 1  Tired, decreased energy  3 1  Change in appetite  3 1  Feeling bad or failure about yourself   0 0  Trouble concentrating  1 0  Moving slowly or fidgety/restless  1 0  Suicidal thoughts  0 0  PHQ-9 Score  12  3   Difficult doing work/chores  Somewhat difficult Not difficult at all     Data saved with a previous flowsheet row definition    Relevant past medical, surgical, family and social history reviewed and updated as indicated. Interim medical history since our last visit reviewed. Allergies and medications reviewed and updated.  Review of Systems  Constitutional: Negative for fever or  weight change.  Respiratory: Negative for cough and shortness of breath.   Cardiovascular: Negative for chest pain or palpitations.  Gastrointestinal: Negative for abdominal pain, no bowel changes.  Musculoskeletal: Negative for gait problem or joint swelling.  Skin: Negative for rash.  Neurological: Negative for dizziness or headache.  No other specific complaints in a complete review of systems (except as listed in HPI above).      Objective:      BP (!) 136/92   Pulse 69   Temp 98 F (36.7 C)   Ht 5' 4 (1.626 m)   Wt 204 lb (92.5 kg)   SpO2 98%   BMI 35.02 kg/m    Wt Readings from Last 3 Encounters:   03/24/24 204 lb (92.5 kg)  07/14/18 190 lb (86.2 kg)  05/14/18 195 lb (88.5 kg)    Physical Exam VITALS: BP- 136/100 GENERAL: Alert, cooperative, well developed, no acute distress. HEENT: Normocephalic, normal oropharynx, moist mucous membranes. CHEST: Clear to auscultation bilaterally, no wheezes, rhonchi, or crackles. CARDIOVASCULAR: Normal heart rate and rhythm, S1 and S2 normal without murmurs. ABDOMEN: Soft, non-tender, non-distended, without organomegaly, normal bowel sounds. EXTREMITIES: No cyanosis or edema. NEUROLOGICAL: Cranial nerves grossly intact, moves all extremities without gross motor or sensory deficit.  Results for orders placed or performed during the hospital encounter of 07/14/18  CBC with Differential/Platelet   Collection Time: 07/14/18  7:22 PM  Result Value Ref Range   WBC 17.4 (H) 4.0 - 10.5 K/uL   RBC 4.87 4.22 - 5.81 MIL/uL   Hemoglobin 15.3 13.0 - 17.0 g/dL   HCT 54.7 60.9 - 47.9 %   MCV 92.8 80.0 - 100.0 fL   MCH 31.4 26.0 - 34.0 pg   MCHC 33.8 30.0 - 36.0 g/dL   RDW 86.6 88.4 - 84.4 %   Platelets 317 150 - 400 K/uL   nRBC 0.0 0.0 - 0.2 %   Neutrophils Relative % 70 %   Neutro Abs 12.2 (H) 1.7 - 7.7 K/uL   Lymphocytes Relative 21 %   Lymphs Abs 3.6 0.7 - 4.0 K/uL   Monocytes Relative 7 %   Monocytes Absolute 1.3 (H) 0.1 - 1.0 K/uL   Eosinophils Relative 1 %   Eosinophils Absolute 0.2 0.0 - 0.5 K/uL   Basophils Relative 0 %   Basophils Absolute 0.1 0.0 - 0.1 K/uL   Immature Granulocytes 1 %   Abs Immature Granulocytes 0.08 (H) 0.00 - 0.07 K/uL  Comprehensive metabolic panel   Collection Time: 07/14/18  7:22 PM  Result Value Ref Range   Sodium 140 135 - 145 mmol/L   Potassium 4.0 3.5 - 5.1 mmol/L   Chloride 105 98 - 111 mmol/L   CO2 24 22 - 32 mmol/L   Glucose, Bld 108 (H) 70 - 99 mg/dL   BUN 10 6 - 20 mg/dL   Creatinine, Ser 9.24 0.61 - 1.24 mg/dL   Calcium  9.3 8.9 - 10.3 mg/dL   Total Protein 8.0 6.5 - 8.1 g/dL   Albumin 4.4 3.5 -  5.0 g/dL   AST 25 15 - 41 U/L   ALT 23 0 - 44 U/L   Alkaline Phosphatase 81 38 - 126 U/L   Total Bilirubin 0.5 0.3 - 1.2 mg/dL   GFR calc non Af Amer >60 >60 mL/min   GFR calc Af Amer >60 >60 mL/min   Anion gap 11 5 - 15          Assessment & Plan:   Problem  List Items Addressed This Visit       Cardiovascular and Mediastinum   Hypertension   Relevant Orders   CBC with Differential/Platelet   Comprehensive metabolic panel with GFR     Other   Hyperlipidemia - Primary   Relevant Orders   Lipid panel   Tobacco abuse   Relevant Orders   Ambulatory Referral Lung Cancer Screening [facility]   Other Visit Diagnoses       Encounter to establish care         Screening for diabetes mellitus       Relevant Orders   Comprehensive metabolic panel with GFR   Hemoglobin A1c     Screening for deficiency anemia       Relevant Orders   CBC with Differential/Platelet     Immunization due       Relevant Orders   Pneumococcal conjugate vaccine 20-valent (Prevnar 20) (Completed)   Varicella-zoster vaccine IM (Shingrix) (Completed)     Screening for prostate cancer       Relevant Orders   PSA     Encounter for hepatitis C screening test for low risk patient       Relevant Orders   Hepatitis C antibody     Screening for colon cancer       Relevant Orders   Ambulatory referral to Gastroenterology        Assessment and Plan Assessment & Plan Establishing care Initial visit to establish care with no acute concerns. History of hyperlipidemia, hypertension, diverticulitis with surgical resection, and COPD exacerbation. No recent lab work since 2020. No current medications for hypertension or hyperlipidemia. No recent diverticulitis episodes. No current COPD symptoms. History of smoking, quit three years ago. No recent lung cancer screening. - Ordered CBC, CMP, lipid panel, and A1c - Scheduled CT scan for lung cancer screening - Discussed shingles and pneumonia vaccines -  Discussed hepatitis B vaccine due to increasing rates in [place]   Primary hypertension Hypertension with a blood pressure reading of 136/100 mmHg. Not currently on antihypertensive medication. No home blood pressure monitoring currently. - Recheck blood pressure - Encouraged home blood pressure monitoring and report readings  Mixed hyperlipidemia Last lipid panel in 2020 showing LDL of 115 mg/dL. Not currently on lipid-lowering medication. - Ordered lipid panel  History of diverticulitis with surgical resection Diverticulitis with surgical resection of 13 inches of lower intestine and sigmoid colon. No recurrence of diverticulitis since surgery. Overdue for colonoscopy based on notes from GI.  Referral placed for colonoscopy.   Tobacco use, remote Smoking cessation three years ago. No current tobacco use.  General health maintenance Due for lung cancer screening and vaccinations. Discussed shingles, pneumonia, and hepatitis B vaccines. Hepatitis B vaccine recommended due to increasing rates in  . - Scheduled CT scan for lung cancer screening - Discussed shingles and pneumonia vaccines - Discussed hepatitis B vaccine        Follow up plan: Return in about 6 months (around 09/21/2024) for follow up. "

## 2024-03-25 LAB — CBC WITH DIFFERENTIAL/PLATELET
Absolute Lymphocytes: 3199 {cells}/uL (ref 850–3900)
Absolute Monocytes: 1045 {cells}/uL — ABNORMAL HIGH (ref 200–950)
Basophils Absolute: 65 {cells}/uL (ref 0–200)
Basophils Relative: 0.5 %
Eosinophils Absolute: 258 {cells}/uL (ref 15–500)
Eosinophils Relative: 2 %
HCT: 46 % (ref 39.4–51.1)
Hemoglobin: 15.2 g/dL (ref 13.2–17.1)
MCH: 29.7 pg (ref 27.0–33.0)
MCHC: 33 g/dL (ref 31.6–35.4)
MCV: 90 fL (ref 81.4–101.7)
MPV: 9.8 fL (ref 7.5–12.5)
Monocytes Relative: 8.1 %
Neutro Abs: 8333 {cells}/uL — ABNORMAL HIGH (ref 1500–7800)
Neutrophils Relative %: 64.6 %
Platelets: 325 Thousand/uL (ref 140–400)
RBC: 5.11 Million/uL (ref 4.20–5.80)
RDW: 12.7 % (ref 11.0–15.0)
Total Lymphocyte: 24.8 %
WBC: 12.9 Thousand/uL — ABNORMAL HIGH (ref 3.8–10.8)

## 2024-03-25 LAB — COMPREHENSIVE METABOLIC PANEL WITH GFR
AG Ratio: 1.5 (calc) (ref 1.0–2.5)
ALT: 29 U/L (ref 9–46)
AST: 19 U/L (ref 10–35)
Albumin: 4.4 g/dL (ref 3.6–5.1)
Alkaline phosphatase (APISO): 83 U/L (ref 35–144)
BUN: 11 mg/dL (ref 7–25)
CO2: 27 mmol/L (ref 20–32)
Calcium: 9.7 mg/dL (ref 8.6–10.3)
Chloride: 102 mmol/L (ref 98–110)
Creat: 0.73 mg/dL (ref 0.70–1.30)
Globulin: 2.9 g/dL (ref 1.9–3.7)
Glucose, Bld: 75 mg/dL (ref 65–99)
Potassium: 4.6 mmol/L (ref 3.5–5.3)
Sodium: 137 mmol/L (ref 135–146)
Total Bilirubin: 0.4 mg/dL (ref 0.2–1.2)
Total Protein: 7.3 g/dL (ref 6.1–8.1)
eGFR: 109 mL/min/1.73m2

## 2024-03-25 LAB — LIPID PANEL
Cholesterol: 279 mg/dL — ABNORMAL HIGH
HDL: 53 mg/dL
LDL Cholesterol (Calc): 195 mg/dL — ABNORMAL HIGH
Non-HDL Cholesterol (Calc): 226 mg/dL — ABNORMAL HIGH
Total CHOL/HDL Ratio: 5.3 (calc) — ABNORMAL HIGH
Triglycerides: 147 mg/dL

## 2024-03-25 LAB — PSA: PSA: 1.18 ng/mL

## 2024-03-25 LAB — HEPATITIS C ANTIBODY: Hepatitis C Ab: NONREACTIVE

## 2024-03-25 LAB — HEMOGLOBIN A1C
Hgb A1c MFr Bld: 5.5 %
Mean Plasma Glucose: 111 mg/dL
eAG (mmol/L): 6.2 mmol/L

## 2024-03-27 ENCOUNTER — Ambulatory Visit: Payer: Self-pay | Admitting: Nurse Practitioner

## 2024-03-31 ENCOUNTER — Other Ambulatory Visit: Payer: Self-pay | Admitting: Nurse Practitioner

## 2024-04-11 ENCOUNTER — Encounter: Payer: Self-pay | Admitting: Acute Care

## 2024-04-11 ENCOUNTER — Telehealth: Payer: Self-pay | Admitting: Acute Care

## 2024-04-11 DIAGNOSIS — Z87891 Personal history of nicotine dependence: Secondary | ICD-10-CM

## 2024-04-11 DIAGNOSIS — Z122 Encounter for screening for malignant neoplasm of respiratory organs: Secondary | ICD-10-CM

## 2024-04-11 NOTE — Telephone Encounter (Signed)
 Lung Cancer Screening Narrative/Criteria Questionnaire (Cigarette Smokers Only- No Cigars/Pipes/vapes)   Sean Herring   SDMV:04/21/24@0900a [facility]                                           [birth year]                LDCT: 04/21/24@11am /[facility] [place]    53 y.o.   Phone: [phone]  Lung Screening Narrative (confirm age 73-77 yrs Medicare / 50-80 yrs Private pay insurance)   Insurance information:[employer]   Referring Provider:Pender   This screening involves an initial phone call with a team member from our program. It is called a shared decision making visit. The initial meeting is required by insurance and Medicare to make sure you understand the program. This appointment takes about 15-20 minutes to complete. The CT scan will completed at a separate date/time. This scan takes about 5-10 minutes to complete and you may eat and drink before and after the scan.  Criteria questions for Lung Cancer Screening:   Are you a current or former smoker? Former Age began smoking: age 44y   If you are a former smoker, what year did you quit smoking? 2023   To calculate your smoking history, I need an accurate estimate of how many packs of cigarettes you smoked per day and for how many years. (Not just the number of PPD you are now smoking)   Years smoking 34 x Packs per day 1.25 = Pack years 44   (at least 20 pack yrs)   (Make sure they understand that we need to know how much they have smoked in the past, not just the number of PPD they are smoking now)  Do you have a personal history of cancer?  No    Do you have a family history of cancer? No mentioned distant cousin  Are you coughing up blood?  No  Have you had unexplained weight loss of 15 lbs or more in the last 6 months? No  It looks like you meet all criteria.     Additional information: N/A

## 2024-04-14 ENCOUNTER — Telehealth: Payer: Self-pay

## 2024-04-14 ENCOUNTER — Other Ambulatory Visit: Payer: Self-pay

## 2024-04-14 DIAGNOSIS — Z1211 Encounter for screening for malignant neoplasm of colon: Secondary | ICD-10-CM

## 2024-04-14 MED ORDER — NA SULFATE-K SULFATE-MG SULF 17.5-3.13-1.6 GM/177ML PO SOLN: 1.0000 | Freq: Once | ORAL | 0 refills | Status: AC

## 2024-04-14 NOTE — Telephone Encounter (Signed)
 Gastroenterology Pre-Procedure Review  Request Date: 05/12/24 Requesting Physician: Dr. Theophilus  PATIENT REVIEW QUESTIONS: The patient responded to the following health history questions as indicated:    1. Are you having any GI issues? no 2. Do you have a personal history of Polyps? no 3. Do you have a family history of Colon Cancer or Polyps? no 4. Diabetes Mellitus? no 5. Joint replacements in the past 12 months?no 6. Major health problems in the past 3 months?no 7. Any artificial heart valves, MVP, or defibrillator?no    MEDICATIONS & ALLERGIES:    Patient reports the following regarding taking any anticoagulation/antiplatelet therapy:   Plavix, Coumadin, Eliquis, Xarelto, Lovenox , Pradaxa, Brilinta, or Effient? no Aspirin? no  Patient confirms/reports the following medications:  No current outpatient medications on file.   No current facility-administered medications for this visit.    Patient confirms/reports the following allergies:  Allergies[1]  No orders of the defined types were placed in this encounter.   AUTHORIZATION INFORMATION Primary Insurance: 1D#: Group #:  Secondary Insurance: 1D#: Group #:  SCHEDULE INFORMATION: Date:  Time: Location:     [1] No Known Allergies

## 2024-04-21 ENCOUNTER — Ambulatory Visit
Admission: RE | Admit: 2024-04-21 | Discharge: 2024-04-21 | Disposition: A | Source: Ambulatory Visit | Attending: Acute Care | Admitting: Acute Care

## 2024-04-21 ENCOUNTER — Encounter: Payer: Self-pay | Admitting: Adult Health

## 2024-04-21 ENCOUNTER — Ambulatory Visit: Admitting: Adult Health

## 2024-04-21 DIAGNOSIS — Z87891 Personal history of nicotine dependence: Secondary | ICD-10-CM

## 2024-04-21 DIAGNOSIS — Z122 Encounter for screening for malignant neoplasm of respiratory organs: Secondary | ICD-10-CM

## 2024-04-21 NOTE — Patient Instructions (Signed)

## 2024-04-21 NOTE — Progress Notes (Signed)
" °  Virtual Visit via Telephone Note  I connected with Sean Herring , 04/21/24 8:57 AM by a telemedicine application and verified that I am speaking with the correct person using two identifiers.  Location: Patient: home Provider: home   I discussed the limitations of evaluation and management by telemedicine and the availability of in person appointments. The patient expressed understanding and agreed to proceed.   Shared Decision Making Visit Lung Cancer Screening Program [id number])   Eligibility: 54 y.o. Pack Years Smoking History Calculation = 43 pack years  (# packs/per year x # years smoked) Recent History of coughing up blood  no Unexplained weight loss? no ( >Than 15 pounds within the last 6 months ) Prior History Lung / other cancer no (Diagnosis within the last 5 years already requiring surveillance chest CT Scans). Smoking Status Former Smoker Former Smokers: Years since quit: 3 years  Quit Date: 2023  Visit Components: Discussion included one or more decision making aids. YES Discussion included risk/benefits of screening. YES Discussion included potential follow up diagnostic testing for abnormal scans. YES Discussion included meaning and risk of over diagnosis. YES Discussion included meaning and risk of False Positives. YES Discussion included meaning of total radiation exposure. YES  Counseling Included: Importance of adherence to annual lung cancer LDCT screening. YES Impact of comorbidities on ability to participate in the program. YES Ability and willingness to under diagnostic treatment. YES  Smoking Cessation Counseling: Former Smokers:  Discussed the importance of maintaining cigarette abstinence. yes Diagnosis Code: Personal History of Nicotine  Dependence. S12.108 Information about tobacco cessation classes and interventions provided to patient. Yes Patient provided with ticket for LDCT Scan. yes Written Order for Lung Cancer Screening with  LDCT placed in Epic. Yes (CT Chest Lung Cancer Screening Low Dose W/O CM) [id number]   Z12.2-Screening of respiratory organs Z87.891-Personal history of nicotine  dependence   [name] 04/21/24        "

## 2024-04-25 ENCOUNTER — Other Ambulatory Visit: Payer: Self-pay | Admitting: Acute Care

## 2024-04-25 DIAGNOSIS — Z122 Encounter for screening for malignant neoplasm of respiratory organs: Secondary | ICD-10-CM

## 2024-04-25 DIAGNOSIS — Z87891 Personal history of nicotine dependence: Secondary | ICD-10-CM

## 2024-05-12 ENCOUNTER — Ambulatory Visit: Admit: 2024-05-12

## 2024-09-29 ENCOUNTER — Ambulatory Visit: Admitting: Nurse Practitioner
